# Patient Record
Sex: Male | Born: 1955 | Race: White | Hispanic: No | Marital: Married | Smoking: Former smoker
Health system: Southern US, Community
[De-identification: ages and names within clinical notes are randomized; demographics above are authoritative.]

## PROBLEM LIST (undated history)

## (undated) DIAGNOSIS — I2510 Atherosclerotic heart disease of native coronary artery without angina pectoris: Secondary | ICD-10-CM

## (undated) DIAGNOSIS — H409 Unspecified glaucoma: Secondary | ICD-10-CM

## (undated) DIAGNOSIS — I252 Old myocardial infarction: Secondary | ICD-10-CM

## (undated) DIAGNOSIS — I10 Essential (primary) hypertension: Secondary | ICD-10-CM

## (undated) DIAGNOSIS — E785 Hyperlipidemia, unspecified: Secondary | ICD-10-CM

## (undated) DIAGNOSIS — Z9861 Coronary angioplasty status: Secondary | ICD-10-CM

## (undated) HISTORY — DX: Unspecified glaucoma: H40.9

## (undated) HISTORY — DX: Coronary angioplasty status: Z98.61

## (undated) HISTORY — PX: CATARACT EXTRACTION W/ INTRAOCULAR LENS  IMPLANT, BILATERAL: SHX1307

## (undated) HISTORY — DX: Hyperlipidemia, unspecified: E78.5

## (undated) HISTORY — DX: Old myocardial infarction: I25.2

## (undated) HISTORY — DX: Essential (primary) hypertension: I10

## (undated) HISTORY — PX: EYE SURGERY: SHX253

## (undated) HISTORY — DX: Atherosclerotic heart disease of native coronary artery without angina pectoris: I25.10

---

## 2004-11-03 ENCOUNTER — Ambulatory Visit: Payer: Self-pay | Admitting: Family Medicine

## 2004-11-05 ENCOUNTER — Ambulatory Visit: Payer: Self-pay | Admitting: Family Medicine

## 2005-10-23 DIAGNOSIS — I2510 Atherosclerotic heart disease of native coronary artery without angina pectoris: Secondary | ICD-10-CM

## 2005-10-23 DIAGNOSIS — Z9861 Coronary angioplasty status: Secondary | ICD-10-CM

## 2005-10-23 DIAGNOSIS — I252 Old myocardial infarction: Secondary | ICD-10-CM

## 2005-10-23 HISTORY — PX: PERCUTANEOUS CORONARY STENT INTERVENTION (PCI-S): SHX6016

## 2005-10-23 HISTORY — DX: Old myocardial infarction: I25.2

## 2005-10-24 ENCOUNTER — Inpatient Hospital Stay (HOSPITAL_COMMUNITY): Admission: EM | Admit: 2005-10-24 | Discharge: 2005-10-28 | Payer: Self-pay | Admitting: Emergency Medicine

## 2005-11-18 ENCOUNTER — Encounter (HOSPITAL_COMMUNITY): Admission: RE | Admit: 2005-11-18 | Discharge: 2006-02-16 | Payer: Self-pay | Admitting: Cardiology

## 2005-12-07 ENCOUNTER — Ambulatory Visit: Payer: Self-pay | Admitting: Family Medicine

## 2006-01-19 ENCOUNTER — Ambulatory Visit: Payer: Self-pay | Admitting: Family Medicine

## 2006-01-20 ENCOUNTER — Ambulatory Visit: Payer: Self-pay | Admitting: Family Medicine

## 2006-04-21 DIAGNOSIS — E7800 Pure hypercholesterolemia, unspecified: Secondary | ICD-10-CM

## 2006-04-21 DIAGNOSIS — I2510 Atherosclerotic heart disease of native coronary artery without angina pectoris: Secondary | ICD-10-CM | POA: Insufficient documentation

## 2006-10-14 ENCOUNTER — Encounter: Payer: Self-pay | Admitting: Family Medicine

## 2006-11-07 ENCOUNTER — Telehealth: Payer: Self-pay | Admitting: Family Medicine

## 2006-11-09 ENCOUNTER — Encounter: Payer: Self-pay | Admitting: Family Medicine

## 2006-12-29 ENCOUNTER — Ambulatory Visit: Payer: Self-pay | Admitting: Family Medicine

## 2008-01-11 ENCOUNTER — Ambulatory Visit: Payer: Self-pay | Admitting: Family Medicine

## 2008-01-11 DIAGNOSIS — I10 Essential (primary) hypertension: Secondary | ICD-10-CM

## 2008-03-05 IMAGING — CR DG CHEST 1V PORT
1 series · 1 of 1 positions shown · non-contrast
Comparison: None

CLINICAL DATA: Chest pain, shortness of breath

PORTABLE CHEST - 1 VIEW:

[view not recorded]
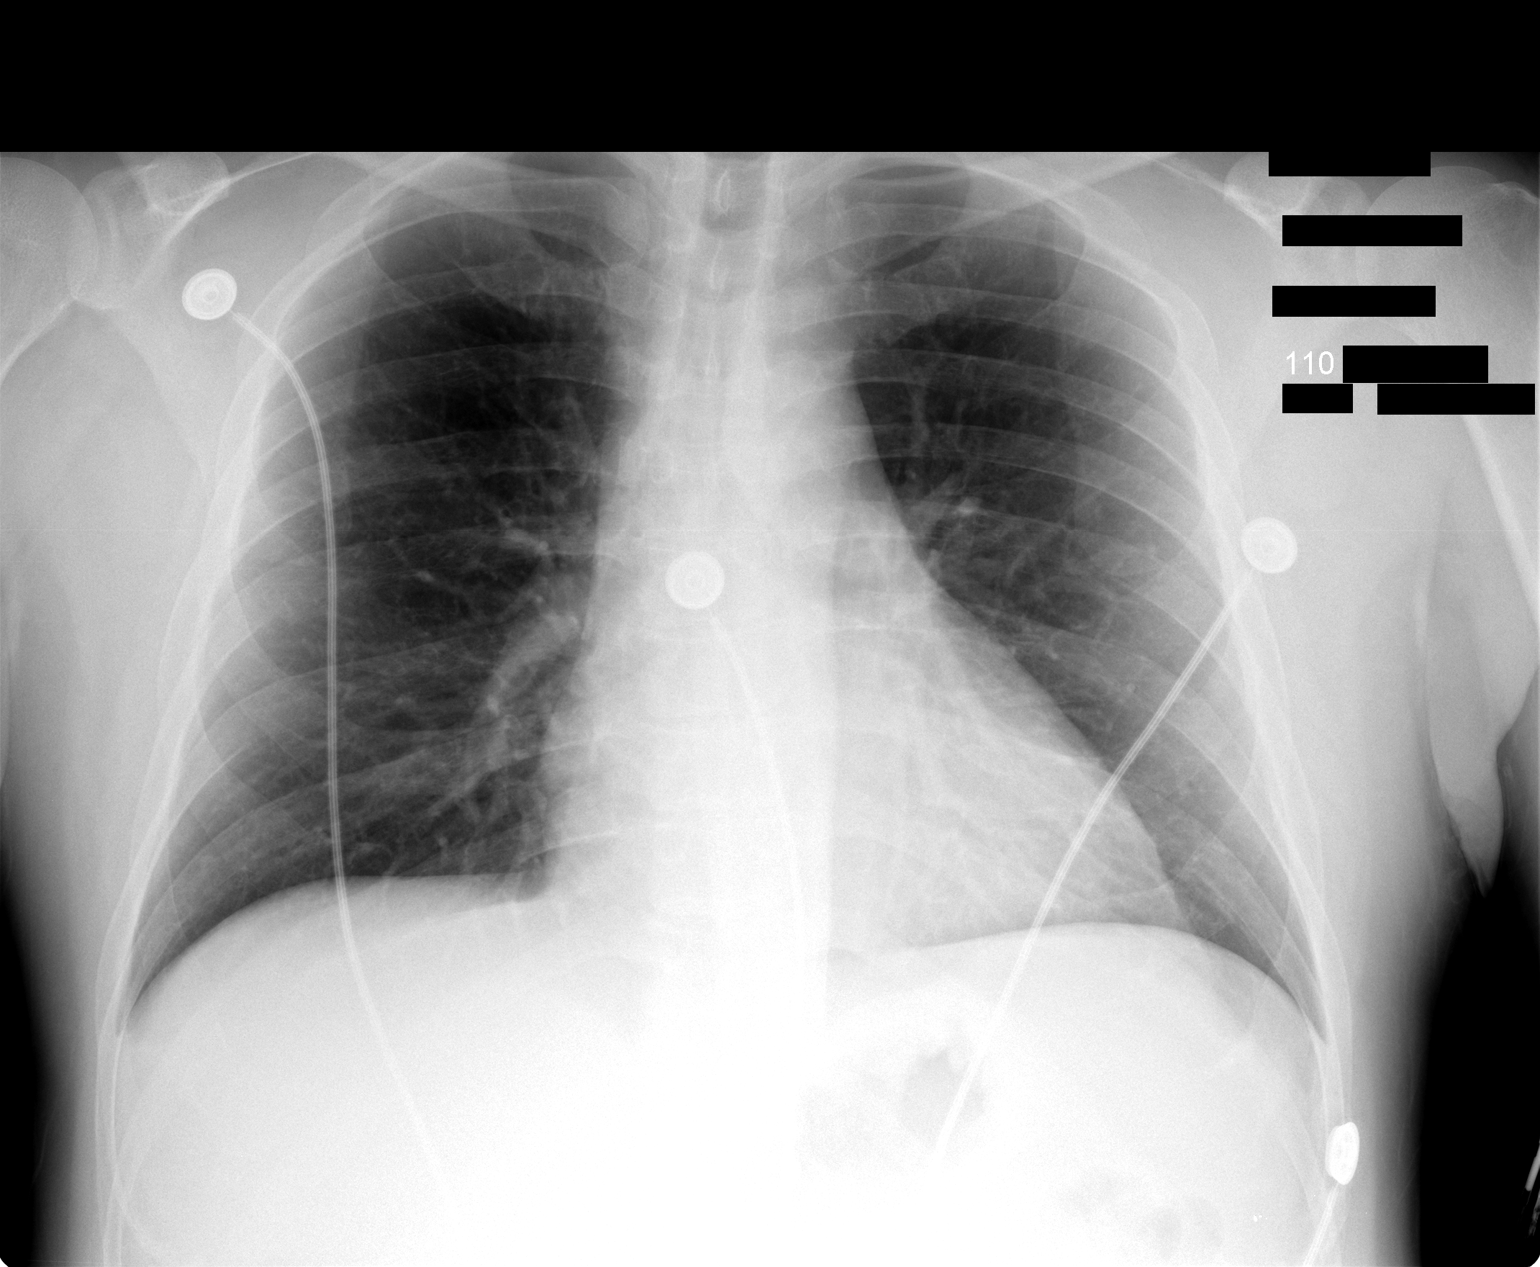

[1 of 1 positions shown; findings below may reference images not displayed]

FINDINGS: The heart size and mediastinal contours are within normal limits. 
Both lungs are clear.  No effusions. Visualized skeleton unremarkable.
IMPRESSION: No acute disease.

## 2008-05-14 ENCOUNTER — Encounter: Payer: Self-pay | Admitting: Family Medicine

## 2008-05-14 ENCOUNTER — Ambulatory Visit: Payer: Self-pay | Admitting: Family Medicine

## 2008-05-14 LAB — CONVERTED CEMR LAB
BUN: 20 mg/dL (ref 6–23)
CO2: 25 meq/L (ref 19–32)
Calcium: 9.5 mg/dL (ref 8.4–10.5)
Chloride: 105 meq/L (ref 96–112)
Cholesterol: 156 mg/dL (ref 0–200)
Creatinine, Ser: 0.87 mg/dL (ref 0.40–1.50)
HDL: 53 mg/dL (ref >39)
Total CHOL/HDL Ratio: 2.9

## 2008-05-15 ENCOUNTER — Encounter: Payer: Self-pay | Admitting: Family Medicine

## 2009-01-02 ENCOUNTER — Encounter: Payer: Self-pay | Admitting: Family Medicine

## 2009-05-08 ENCOUNTER — Ambulatory Visit: Payer: Self-pay | Admitting: Family Medicine

## 2009-05-21 ENCOUNTER — Ambulatory Visit: Payer: Self-pay | Admitting: Family Medicine

## 2009-05-21 ENCOUNTER — Encounter: Payer: Self-pay | Admitting: Family Medicine

## 2009-05-22 LAB — CONVERTED CEMR LAB
CO2: 23 meq/L (ref 19–32)
Calcium: 9 mg/dL (ref 8.4–10.5)
Cholesterol: 212 mg/dL — ABNORMAL HIGH (ref 0–200)
Creatinine, Ser: 0.78 mg/dL (ref 0.40–1.50)
Glucose, Bld: 104 mg/dL — ABNORMAL HIGH (ref 70–99)
Sodium: 141 meq/L (ref 135–145)
Total Bilirubin: 0.7 mg/dL (ref 0.3–1.2)
Total Protein: 6.4 g/dL (ref 6.0–8.3)
Triglycerides: 77 mg/dL (ref <150)
VLDL: 15 mg/dL (ref 0–40)

## 2010-01-14 ENCOUNTER — Ambulatory Visit: Payer: Self-pay | Admitting: Family Medicine

## 2010-01-14 LAB — CONVERTED CEMR LAB
Albumin: 4.4 g/dL (ref 3.5–5.2)
Indirect Bilirubin: 0.4 mg/dL (ref 0.0–0.9)
LDL Cholesterol: 103 mg/dL — ABNORMAL HIGH (ref 0–99)
Total CHOL/HDL Ratio: 3.7
Total Protein: 6.9 g/dL (ref 6.0–8.3)
Triglycerides: 153 mg/dL — ABNORMAL HIGH (ref <150)
VLDL: 31 mg/dL (ref 0–40)

## 2010-01-19 ENCOUNTER — Encounter: Payer: Self-pay | Admitting: Family Medicine

## 2010-03-10 ENCOUNTER — Encounter: Payer: Self-pay | Admitting: Family Medicine

## 2010-03-24 NOTE — Letter (Signed)
Summary: Generic Letter  [facility]  [address]   [place], [place] [address]   Phone: [phone]  Fax: [phone]    01/19/2010  SORIN FRIMPONG [address] [place], [place]  [address]  Dear Mr. Brinker,  Hope all is well.  Your cholesterol is better than last check but not quite as low as we would like.   You can continue to work on your diet and exercise and we can discuss at your next visit or we can change your cholesterol medicine.  Call if you would like to discuss.   Happy holidays!   Cholesterol               184 mg/dL                   6-606     ATP III Classification:           < 200        mg/dL        Desirable          200 - 239     mg/dL        Borderline High          >= 240        mg/dL        High         Triglyceride         [H]  153 mg/dL                   <301   HDL Cholesterol           50 mg/dL                    >60   Total Chol/HDL Ratio      3.7 Ratio  VLDL Cholesterol (Calc)                             31 mg/dL                    1-09  LDL Cholesterol (Calc)                        [H]  103 mg/dL                   3-23      Sincerely,  Doneta Public MD  Appended Document: Generic Letter mailed

## 2010-03-24 NOTE — Assessment & Plan Note (Signed)
Summary: cpe,tcb   Vital Signs:  Patient profile:   55 year old male Height:      65 inches Weight:      151 pounds BMI:     25.22 BSA:     1.76 Temp:     98.1 degrees F Pulse rate:   63 / minute BP sitting:   126 / 72  Vitals Entered By: Jone Baseman CMA (May 08, 2009 11:05 AM) CC: CPE Is Patient Diabetic? No Pain Assessment Patient in pain? no        CC:  CPE.  History of Present Illness: Feels well no complaints  Current Problems:  HYPERTENSION (ICD-401.9) taking medications regularly.  No chest pain or shortness of breath or lightheadedness Walks regulalry  HYPERCHOLESTEROLEMIA (ICD-272.0) taking zocor daily.  No right upper quadrant pain or muscle aches  CORONARY, ARTERIOSCLEROSIS (ICD-414.00) No chest pain with exertion or leg pain  ROS - as above PMH - Medications reviewed and updated in medication list.  Smoking Status noted in VS form      Habits & Providers  Alcohol-Tobacco-Diet     Tobacco Status: never  Current Medications (verified): 1)  Altace 10 Mg Caps (Ramipril) .[name].. 1 Daily For Blood Pressure 2)  Aspirin 325 Mg Tabs (Aspirin) .... Take 1 Tab By Mouth Every Day 3)  Metoprolol Succinate 25 Mg Tb24 (Metoprolol Succinate) .... Once A Day 4)  Simvastatin 40 Mg Tabs (Simvastatin) .... Take One Tablet At Bedtime  Allergies: No Known Drug Allergies  Review of Systems  The patient denies anorexia, fever, weight loss, weight gain, vision loss, decreased hearing, chest pain, peripheral edema, prolonged cough, headaches, hemoptysis, abdominal pain, melena, hematochezia, severe indigestion/heartburn, hematuria, incontinence, genital sores, muscle weakness, suspicious skin lesions, difficulty walking, depression, unusual weight change, enlarged lymph nodes, angioedema, and testicular masses.    Physical Exam  General:  Well-developed,well-nourished,in no acute distress; alert,appropriate and cooperative throughout examination Head:   Normocephalic and atraumatic without obvious abnormalities. No apparent alopecia or balding. Nose:  External nasal examination shows no deformity or inflammation. Nasal mucosa are pink and moist without lesions or exudates. Mouth:  Oral mucosa and oropharynx without lesions or exudates.  Teeth in good repair. Neck:  No deformities, masses, or tenderness noted. Lungs:  Normal respiratory effort, chest expands symmetrically. Lungs are clear to auscultation, no crackles or wheezes. Heart:  Normal rate and regular rhythm. S1 and S2 normal without gallop, murmur, click, rub or other extra sounds. Abdomen:  Bowel sounds positive,abdomen soft and non-tender without masses, organomegaly or hernias noted. Genitalia:  Testes bilaterally descended without nodularity, tenderness or masses. No scrotal masses or lesions. No penis lesions or urethral discharge. Msk:  No deformity or scoliosis noted of thoracic or lumbar spine.   Extremities:  No clubbing, cyanosis, edema, or deformity noted with normal full range of motion of all joints.   Skin:  Intact without suspicious lesions or rashes  diffuse rosacea/seborrhea Cervical Nodes:  No lymphadenopathy noted   Impression & Recommendations:  Problem # 1:  HYPERTENSION (ICD-401.9) Well controlled His updated medication list for this problem includes:    Altace 10 Mg Caps (Ramipril) .[name]... 1 daily for blood pressure    Metoprolol Succinate 25 Mg Tb24 (Metoprolol succinate) ..... Once a day  Future Orders: Comp Met-[facility] [id number]) ... 04/23/2010  BP today: 126/72 Prior BP: 121/76 (01/11/2008)  Labs Reviewed: K+: 4.5 (05/14/2008) Creat: : 0.87 (05/14/2008)   Chol: 156 (05/14/2008)   HDL: 53 (05/14/2008)   LDL: 88 (05/14/2008)  TG: 73 (05/14/2008)  Problem # 2:  HYPERCHOLESTEROLEMIA (ICD-272.0) Chck fasting soon  His updated medication list for this problem includes:    Simvastatin 40 Mg Tabs (Simvastatin) .[name]... Take one tablet at bedtime  Future  Orders: Lipid-[facility] ([id number]) ... 04/23/2010  Problem # 3:  ROUTINE GENERAL MEDICAL EXAM, NON PEDIATRIC (ICD-V70.0)  colonoscopy.   Discussed his steady mild weight gain and that needs to stop   Orders: [facility] - Est  40-64 yrs ([id number])  Complete Medication List: 1)  Altace 10 Mg Caps (Ramipril) .[name].. 1 daily for blood pressure 2)  Aspirin 325 Mg Tabs (Aspirin) .... Take 1 tab by mouth every day 3)  Metoprolol Succinate 25 Mg Tb24 (Metoprolol succinate) .... Once a day 4)  Simvastatin 40 Mg Tabs (Simvastatin) .... Take one tablet at bedtime  Other Orders: Future Orders: PSA-[facility] ([id number]) ... 04/23/2010  Patient Instructions: 1)  Please schedule a follow-up appointment in 1 year.  2)  I will send you a letter about your lab tests Remember to get them fasting 3)  Colonoscopy  Prevention & Chronic Care Immunizations   Influenza vaccine: Not documented    Tetanus booster: 01/11/2008: Tdap    Pneumococcal vaccine: Not documented  Colorectal Screening   Hemoccult: Not documented   Hemoccult due: Not Indicated    Colonoscopy: Not documented  Other Screening   PSA: 1.59  (05/14/2008)   PSA ordered.   Smoking status: never  (05/08/2009)  Lipids   Total Cholesterol: 156  (05/14/2008)   LDL: 88  (05/14/2008)   LDL Direct: Not documented   HDL: 53  (05/14/2008)   Triglycerides: 73  (05/14/2008)    SGOT (AST): 33  (05/14/2008)   SGPT (ALT): 29  (05/14/2008) CMP ordered    Alkaline phosphatase: 60  (05/14/2008)   Total bilirubin: 0.6  (05/14/2008)    Lipid flowsheet reviewed?: Yes   Progress toward LDL goal: At goal  Hypertension   Last Blood Pressure: 126 / 72  (05/08/2009)   Serum creatinine: 0.87  (05/14/2008)   Serum potassium 4.5  (05/14/2008) CMP ordered     Hypertension flowsheet reviewed?: Yes   Progress toward BP goal: At goal  Self-Management Support :    Hypertension self-management support: Not documented    Hypertension self-management support  not done because: Good outcomes  (05/08/2009)    Lipid self-management support: Not documented     Lipid self-management support not done because: Good outcomes  (05/08/2009)

## 2010-03-26 ENCOUNTER — Encounter: Payer: Self-pay | Admitting: *Deleted

## 2010-03-26 NOTE — Consult Note (Signed)
Summary: [facility]   Imported By: De Nurse 03/20/2010 15:57:43  _____________________________________________________________________  External Attachment:    Type:   Image     Comment:   External Document

## 2010-07-10 NOTE — Discharge Summary (Signed)
[name], [name]               ACCOUNT NO.:  [id number]   MEDICAL RECORD NO.:  [id number]          PATIENT TYPE:  INP   LOCATION:  6525                         FACILITY:  [facility]   PHYSICIAN:  Cristy Hilts. Jacinto Halim, MD       DATE OF BIRTH:  [birth year]   DATE OF ADMISSION:  10/24/2005  DATE OF DISCHARGE:  10/28/2005                               DISCHARGE SUMMARY   PRIMARY CARE PHYSICIAN:  Dr. Deirdre Priest.   FINAL DIAGNOSES:  1. Non-ST-elevation myocardial infarction.  2. Hypertension.  3. Dyslipidemia.  4. Obesity.  5. Obstructive sleep apnea.  6. Obesity.  7. Alcohol use.  8. Gastroesophageal reflux.  9. Nonsustained ventricular tachycardia.   PROCEDURE:  Left heart catheterization and coronary arteriogram  performed on October 26, 2005 by Dr. Yates Decamp revealing normal LV  function without mitral regurgitation.  Angiographic information:  The  right coronary artery had mild luminal irregularities in the proximal  vessel as well as the mid-vessel, which was followed by tandem 99%  thrombotic stenosis in the distal RCA.  In the PDA, there was mild 40 to  50% haziness in the ostium.  The left main was normal.  The circumflex  was normal.  The ramus was normal.  The LAD was normal as well as the  first diagonal.  Intervention:  PTCA and stenting were performed on the  right coronary artery with a 3 x 13 mm Cypher stent overlapped with a  3.5 x 18 mm Cypher stent, taking 99% stenosis to 0% residual.   COMPLICATIONS:  None.   BRIEF HISTORY:  [name] is a 55 year old male with a history of  dyslipidemia and hypertension who presented to the emergency department  at [facility] emergency department with arm pain and chest discomfort  for five days intermittently.  It lasted one to two minutes and was  accompanied by mild diaphoresis.  He was admitted to [facility]  to rule out myocardial infarction and for further evaluation.  Please  see history and physical for further  details.   HOSPITAL COURSE:  He underwent left heart catheterization and coronary  arteriogram on October 26, 2005 by Dr. Jacinto Halim with the above findings  after elevation of enzymes was noted.  His EKG remained stable without  evidence of acute changes.  He underwent PTCA stenting of the right  coronary artery and he tolerated the procedure without difficulty.  Ejection fraction on cath was noted to be 60%.  He did suffer non-ST-  elevation myocardial infarction with an elevated troponin.  He also had  60% residual disease in the ostium of the PDA and it was recommended by  Dr. Jacinto Halim that we treat this medically with aggressive management of his  lipids and hypertension.  On October 28, 2005, noted a 14-beat run of  nonsustained ventricular tachycardia.  This occurred post-PCI with which  he was asymptomatic.  On the day of discharge, he is ambulating in the  hallway without difficulty, no chest pain or shortness of breath, no  lightheadedness or syncope, no palpitations or tachycardia.   LABORATORY DATA:  Cardiac markers on admission:  CK was 26, CK-MB 12.4  with a troponin of 0.04.  On October 25, 2005, his CK was 26, CK-MB  12.7, and troponin 0.015.  On October 26, 2005, his CK was 48, CK-MB  11.5 and troponin was 0.68.  Lipid panel revealed a triglyceride level  of 229, HDL 37, and LDL of 114, total cholesterol was 197.   DISCHARGE INSTRUCTIONS:  He was instructed to return to work after he  followed up with Dr. Jacinto Halim on November 05, 2005.  He was to follow a  low-fat, low-cholesterol, low-sodium diet and increase his activity  slowly.  He was to avoid driving for the week following his discharge as  well as to avoid heavy lifting for the next two weeks.  He is to contact  us with any pain, redness, swelling or drainage from his cath site or  with any problems or questions.  He is scheduled for a follow-up  appointment with Dr. Jacinto Halim on November 05, 2005 at 12:15 PM and it was   recommended that he have a liver and lipid panel done in three to four  weeks post-discharge.   DISCHARGE MEDICATIONS:  1. Aspirin 325 mg daily.  2. Plavix 75 mg daily.  3. Altace 10 mg daily.  4. Protonix 40 mg daily.  5. Vytorin 10/40 mg daily.  6. Coreg CR 40 mg daily.  7. Sublingual nitroglycerin as needed.     ______________________________  Charmian Muff, NP      Cristy Hilts Jacinto Halim, MD  Electronically Signed    LS/MEDQ  D:  03/30/2006  T:  03/31/2006  Job:  [id number]   cc:   Pearlean Brownie, M.D.

## 2010-07-10 NOTE — Cardiovascular Report (Signed)
NAMECRAY, MONNIN               ACCOUNT NO.:  [id number]   MEDICAL RECORD NO.:  [id number]          PATIENT TYPE:  INP   LOCATION:  6525                         FACILITY:  [facility]   PHYSICIAN:  Cristy Hilts. Jacinto Halim, MD       DATE OF BIRTH:  01-21-1956   DATE OF PROCEDURE:  10/26/2005  DATE OF DISCHARGE:                              CARDIAC CATHETERIZATION   PROCEDURE PERFORMED:  1. Left ventriculography.  2. Selective right and left coronary angiography.  3. Percutaneous transluminal coronary angioplasty and stenting of the      right coronary artery.   INDICATIONS FOR PROCEDURE:  Mr. Spease is a 55 year old gentleman with a  history of borderline hypertension and hyperlipidemia who was admitted to  the hospital with unstable angina with chest pain with radiation to both his  elbow and his wrist.  His troponins were positive for myocardial injury.  With this, he was brought to the cardiac catheterization lab to evaluate his  coronary anatomy.   HEMODYNAMIC DATA:  Left ventricular pressure 139/7 with end diastolic  pressure of 15 mmHg.  The aortic pressure was 142/82 with a mean of 107  mmHg.  There was no pressure gradient across the aortic valve.   ANGIOGRAPHIC DATA:  Left ventricle:  The left ventricular systolic function was normal with  ejection fraction of 60%.  There was no significant mitral regurgitation.   Right coronary artery:  The right coronary artery is a large vessel and is  dominant vessel.  It has a mild luminal irregularity in its proximal and mid  segment followed by a tandem 99% thrombotic stenosis in the distal RCA.  The  RCA gives origin to a large PLV and a moderate size PDA which has mild 40-  50% haziness in its ostium.   Left main:  The left main is large caliber vessel, this appears normal.   Circumflex:  The circumflex is a small vessel and is normal.   Ramus intermediate:  The ramus intermediate is a moderate to large caliber  vessel.  It is smooth and  normal.   Left anterior descending:  The left anterior descending  is a large caliber  vessel.  It ends before reaching the apex.  It is smooth and normal.  The  LAD gives origin to a large diagonal 1 which is smooth and normal.   INTERVENTIONAL DATA:  Successful PTCA and stenting of the RCA with a 3 by 13  mm and overlapped with a 3.5 by 18 mm Cypher.  These stents were deployed at  16 atmospheres pressure.  Overall, the stenosis was reduced from 99% to 0%  with TIMI III to TIMI III flow maintained at the end of the procedure.  There was residual 60% stenosis in the ostium of the PDA which is small to  moderate size.   RECOMMENDATIONS:  Continued aggressive risk factor modification is  indicated.  The LDL goal is less than or equal to 70.  Blood pressure goals  of 130/80 mmHg with weight loss and exercise program is indicated.   A total of 150 mL  of contrast was utilized for diagnostic angiography.   TECHNIQUES OF PROCEDURE:  In the usual sterile technique, using a 6 French  right femoral artery access,  a 6 [place] multipurpose B2 catheter was  advanced from the ascending aorta and a 0.035 inch J-wire.  The catheter was  gently advanced to the left ventricle.  Left ventricular pressure was  monitored.  Hand contrast injection of the left ventricle was performed,  both in LAO and RAO projections.  The catheter was flushed with saline and  pulled back into the ascending aorta and pressure gradient across the aortic  valve was monitored.  The right coronary artery was selectively engaged and  angiography was performed.  Then, the left main coronary artery was  selectively engaged and angiography was performed.  Then, the catheter was  pulled out of the body in the usual fashion.   TECHNIQUES OF INTERVENTION:  Using Angiomax for anticoagulation and a 7  [place] AR1 as a guiding catheter, 190 cm by 0.014 inch ADW guide-wire was  advanced in the right coronary artery and was carefully  positioned in the  PLV branch.  Then, using a 3 by 20 mm Maverick balloon, multiple inflations  at 14 atmospheres pressure were performed.  Post procedure, the stenosis was  reduced to around 50% but there was still the presence of haziness.  I could  visualize the thrombus between the tandem stenosis.  Then, I decided to  proceed with stenting with a 3 by 13 mm Cypher, this was deployed at 12  atmospheres pressure for 50 seconds.  A second 3.5 by 23 mm stent was  utilized to overlap the previously placed distal stent.  This was deployed  at 16 atmospheres pressure for 60 seconds.  The same stent balloon was  advanced distally and keeping the balloon at the edge of the 3 mm stent, I  performed a 10 atmosphere balloon inflation for 45 seconds.  Then the  balloon was pulled back between the two stents closer to the second stent in  flow and a 16 atmospheres pressure dilatation was performed for 45 seconds.  The balloon was then deflated, pulled back in the guiding catheter, and  angiography was performed.  Intracoronary nitroglycerin was administered at  multiple episodes.   Because of the presence of thrombus following balloon angioplasty, I decided  to use bail out Integrilin.  Overall, at the end of the procedure, excellent  brisk flow was noted without any evidence of thrombus or evidence of  dissection.  TIMI-III flow was noted.  The patient tolerated the procedure  well.  No immediate complications were noted.      Cristy Hilts. Jacinto Halim, MD  Electronically Signed     JRG/MEDQ  D:  10/26/2005  T:  10/26/2005  Job:  [id number]   cc:   Deirdre Peer. Nehemiah Settle, M.D.  Pearlean Brownie, M.D.

## 2010-07-10 NOTE — H&P (Signed)
NAMEVALMORE, ARABIE               ACCOUNT NO.:  [id number]   MEDICAL RECORD NO.:  [id number]          PATIENT TYPE:  EMS   LOCATION:  ED                           FACILITY:  [facility]   PHYSICIAN:  Deirdre Peer. Polite, M.D. DATE OF BIRTH:  05/25/1955   DATE OF ADMISSION:  10/24/2005  DATE OF DISCHARGE:                                HISTORY & PHYSICAL   CHIEF COMPLAINT:  Chest pain.   HISTORY OF PRESENT ILLNESS:  This 55 year old male with no significant past  medical history except borderline hypertension on no meds, who presents to  the ED after having recurrent bouts of atypical chest pain.  According to  the patient, __________to the last week, he has been walking to work  approximately three blocks.  The patient stated that after walking about  three blocks, he felt an unusual pain in his elbows and wrists.  It seemed  to last about 30 to 60 seconds.  The patient has had recurrence of his  symptoms upon walking to work the following days of Wednesday as well as  Thursday.  The patient stated that he took some NSAID's that he thinks made  him feel a little better.  On Saturday, the patient stated that he awoke and  felt that unusual sensation again, however, he still tried to walk the  sensation off.  Upon walking, he had increasing shortness of breath, this  time stated that it seemed to radiate from his chest to both arms and felt  weak.  He denied any palpitations, no shortness of breath, no diaphoresis.  At baseline, the patient denies any orthopnea, PND, dyspnea on exertion;  however, he has not been exercising much in the past.  Denies any pedal  edema.  He states that his lipid status is unknown.  He does have a family  history of coronary artery disease.  In the ED, the patient was evaluated  and found to be hypertensive; otherwise, hemodynamically stable.  His EKG is  without acute changes, point-of-care enzymes within normal limits, chest x-  ray no current disease, CBC and  BMET within normal limits.  [facility] was called for evaluation and admission.  Admission is deemed  necessary for further evaluation and treatment.   PAST MEDICAL HISTORY:  1. Borderline hypertension.  2. Probable GERD for which he has been taking occasional Rolaids.   MEDICATIONS ON ADMISSION:  Occasional Rolaids.   SOCIAL HISTORY:  Negative for tobacco, positive for alcohol 6 to 12 beers on  the weekend, denies any withdrawal symptoms, denies any drugs.   PAST SURGICAL HISTORY:  Surgery for cataracts.   ALLERGIES:  None.   FAMILY HISTORY:  His father with an MI in his 8s plus a history of  hypertension, mother coronary artery disease, and brothers with  hypertension.   REVIEW OF SYSTEMS:  As stated in the HPI; otherwise negative.   PHYSICAL EXAMINATION:  GENERAL:  The patient is alert and oriented x3.  VITAL SIGNS:  Temp 98.6, BP 145/90, pulse 78, respiratory rate of 20,  saturation 98%.  HEENT:  Within normal limits.  CHEST:  Clear to auscultation bilateral without rales, rhonchi, or wheezes.  CARDIOVASCULAR:  Regular, no S3.  The patient does not have any carotid  bruits.  ABDOMEN:  Slightly obese and nontender.  EXTREMITIES:  No clubbing, cyanosis, or edema.  No calf pain.   ASSESSMENT:  1. Chest pain.  2. Probable hypertension.  3. Family history of coronary artery disease.   Recommend the patient be admitted to a telemetry floor bed.  The patient  will have serial cardiac enzymes, and he will be seen by Cardiology for  further risk stratification, i.e., stress test.  Will obtain baseline labs,  i.e., lipids, a TSH, and a hemoglobin A1c.  Will place this patient on a PPI  as well as DVT prophylaxis, and make further recommendations as deemed  necessary.      Deirdre Peer. Polite, M.D.  Electronically Signed     RDP/MEDQ  D:  10/24/2005  T:  10/24/2005  Job:  [id number]

## 2010-07-10 NOTE — Discharge Summary (Signed)
[name], [name]               ACCOUNT NO.:  [id number]   MEDICAL RECORD NO.:  [id number]          PATIENT TYPE:  INP   LOCATION:  1415                         FACILITY:  [facility]   PHYSICIAN:  Cristy Hilts. Jacinto Halim, MD       DATE OF BIRTH:  04/15/1955   DATE OF ADMISSION:  10/24/2005  DATE OF DISCHARGE:  10/26/2005                                 DISCHARGE SUMMARY   ADMITTING PHYSICIAN:  Deirdre Peer. Polite, M.D.   Tera Partridge CARDIOLOGISTCristy Hilts. Jacinto Halim, M.D.   FINAL DIAGNOSES:  1. Coronary atherosclerotic heart disease.  2. Unstable angina pectoris.  3. Elevated cardiac enzymes with elevated troponin.   Dictation ended at this point.  Requested to cancel.     ______________________________  Charmian Muff, NP      Cristy Hilts Jacinto Halim, MD     LS/MEDQ  D:  10/28/2005  T:  10/28/2005  Job:  [id number]

## 2010-07-10 NOTE — Consult Note (Signed)
NAMEDELONTE, MUSICH               ACCOUNT NO.:  [id number]   MEDICAL RECORD NO.:  [id number]          PATIENT TYPE:  EMS   LOCATION:  ED                           FACILITY:  [facility]   PHYSICIAN:  Cristy Hilts. Jacinto Halim, MD       DATE OF BIRTH:  [birth year]   DATE OF CONSULTATION:  DATE OF DISCHARGE:                                   CONSULTATION   REASON FOR CONSULTATION:  Chest pain, unstable angina.   HISTORY:  Harry Shuck is a 55 year old gentleman with borderline  hyperlipidemia and history of hypertension, presently not on medical  therapy.  Has been complaining of chest pain which has been going on for the  past 5 days.   Describes it as a pressure like sensation in the middle of his chest with  radiation to both his arms, both his elbow and wrist.  This occurred for  about a minute or two with some spontaneous relief .  The severity of the  pain is described as very mild.  However, in the last 2 days he has had more  frequent episodes and today, this morning, after he woke up had three  episodes, one after another, and hence he presented to the emergency room.   He had one mild episode of similar chest pain while he was in the hospital.  However, physically he feels comfortable and he denies any chest pain.   This last episode of chest pain at home was associated with mild  diaphoresis.  No associated nausea, vomiting.   REVIEW OF SYSTEMS:  He denies any shortness of breath, paroxysmal nocturnal  dyspnea or orthopnea.  He has not had any syncope.  He has no bowel or  bladder disease.  He is not a diabetic.  His other systems are negative.  He  does have significant snoring.  Has sleep apnea symptoms.   PAST MEDICAL HISTORY:  Hypertension, hyperlipidemia, not on therapy.   PAST SURGICAL HISTORY:  He has had bilateral cataract surgery about 20 years  ago.   SOCIAL HISTORY:  He is married.  Lives with his wife.  Works as a Comptroller  at [employer].  He does not smoke.  He drinks about 8  to 10 beers on Saturday and  again, 8 to 10 beers on Sunday.  He has markedly sedentary lifestyle.   FAMILY HISTORY:  Positive for coronary artery disease.  Father died with  massive myocardial infarction and stroke at the age of 38.  Mother had  coronary disease and had blockages in her heart at 53 years of age.  Has 2  brothers, 82 and 60, have hypertension.  A sister who is 33, alive, healthy  with hypertension.   PRESENT MEDICATIONS:  None.   ALLERGIES:  NO KNOWN DRUG ALLERGIES.   PHYSICAL EXAMINATION:  GENERAL:  He is well built.  Appears to be obese.  He  appears to be in no acute distress .  VITAL SIGNS:  Heart rate 97.6, pulse 76 beats per minute , regular  respiration of 16, blood pressure 156/92 mmHg both arms.  CARDIAC:  S1, S2 was normal.  There is no gallop or murmur appreciated.  CHEST:  Bilaterally clear with no crackles.  ABDOMEN:  Obese.  No hepatosplenomegaly.  Bowel sounds heard.  EXTREMITIES:  No edema.  Peripheral aspect exam was normal.  There was no  lymphadenopathy.   EKG revealed normal sinus rhythm without any ST-T wave changes or ischemia.  His electrolytes, BUN and creatinine were within normal limits.  CBC was  within normal limits.  His point of care markers for cardiac injury were  negative x2.   IMPRESSION:  1. Chest pain with radiation to both the elbows and the wrist, suggestion      of unstable angina.  This unstable angina could be related to      uncontrolled hypertension with htn heart disease heart disease with      subendocardial ischemia versus underlying coronary artery disease.  2. Hypertension.  3. Hyperlipidemia.  4. Obesity.  5. Alcohol abuse.  6. Strong family history of coronary artery disease.  7. Sedentary lifestyle.   RECOMMENDATIONS:  1. I am going to agree with this admission and he needs aggressive blood      pressure control.  His chest pain could be related to  myocardial      ischemia.  Patient admitted to the  hospital.  I agree with aggressive      control of blood pressure.  I will start him on Coreg at 6.25 b.i.d.      along with Altace 5 mg p.o. at bedtime.  As he is presently      asymptomatic I agree with sublingual nitroglycerine on a p.r.n. basis.      However, I would fully anticoagulate him with Lovenox, full dose.  2. If he has complete relief of chest pain without any recurrence, then we      could potentially consider either discharging 24 to 48 hours versus      proceeding directly with heart catheterization to delineate his current      anatomy.  His wife is present at the bedside.  I have discussed with      her regarding alternatives and risk associated with heart      catheterization including less than 1% risk of death, stroke, MI and      urgent need for bypass surgery with cardiac catheterization and 2-3%      with  PCI but not limited to these.  Patient understands.  3. We will check his lipids.  I will also start him on lipid-lower      therapy.  4. Lifestyle modification.  Risk factor modification is indicated.  I have      discussed these issues with the patient and his wife.      Cristy Hilts. Jacinto Halim, MD  Electronically Signed     JRG/MEDQ  D:  10/24/2005  T:  10/25/2005  Job:  [id number]   cc:   Deirdre Peer. Nehemiah Settle, M.D.   Pearlean Brownie, M.D.

## 2011-02-10 ENCOUNTER — Ambulatory Visit: Payer: Self-pay | Admitting: Family Medicine

## 2012-06-07 ENCOUNTER — Other Ambulatory Visit (HOSPITAL_COMMUNITY): Payer: Self-pay | Admitting: Cardiology

## 2012-06-07 DIAGNOSIS — I208 Other forms of angina pectoris: Secondary | ICD-10-CM

## 2012-06-13 ENCOUNTER — Ambulatory Visit (HOSPITAL_COMMUNITY)
Admission: RE | Admit: 2012-06-13 | Discharge: 2012-06-13 | Disposition: A | Discharge status: Home / Self Care | Payer: BC Managed Care – PPO | Source: Ambulatory Visit | Attending: Cardiology | Admitting: Cardiology

## 2012-06-13 DIAGNOSIS — I208 Other forms of angina pectoris: Secondary | ICD-10-CM

## 2012-06-13 DIAGNOSIS — I209 Angina pectoris, unspecified: Secondary | ICD-10-CM | POA: Insufficient documentation

## 2012-06-13 MED ORDER — TECHNETIUM TC 99M SESTAMIBI GENERIC - CARDIOLITE
31.0000 | Freq: Once | INTRAVENOUS | Status: AC | PRN
  Administered 2012-06-13: 31 via INTRAVENOUS

## 2012-06-13 MED ORDER — TECHNETIUM TC 99M SESTAMIBI GENERIC - CARDIOLITE
10.2000 | Freq: Once | INTRAVENOUS | Status: AC | PRN
  Administered 2012-06-13: 10 via INTRAVENOUS

## 2012-06-13 NOTE — Procedures (Addendum)
[name] [address] [place] [address] [phone]  Cardiology Nuclear Med Study  [name] is a 57 y.o. male     MRN : [id number]     DOB: 11-05-1955  Procedure Date: 06/13/2012  Nuclear Med Background Indication for Stress Test:  Stent Patency History:  CAD;STENT/PTCA--10/2005 Cardiac Risk Factors: Family History - CAD, History of Smoking, Hypertension and Lipids  Symptoms:  Chest Pain and Fatigue   Nuclear Pre-Procedure Caffeine/Decaff Intake:  7:00pm NPO After: 5:00am   IV Site: R Antecubital  IV 0.9% NS with Angio Cath:  22g  Chest Size (in):  42" IV Started by: Emmit Pomfret, RN  Height: 5\' 5"  (1.651 m)  Cup Size: n/a  BMI:  Body mass index is 24.63 kg/(m^2). Weight:  148 lb (67.132 kg)   Tech Comments:  N/A    Nuclear Med Study 1 or 2 day study: 1 day  Stress Test Type:  Stress  Order Authorizing Provider:  Bryan Lemma, MD   Resting Radionuclide: Technetium 55m Sestamibi  Resting Radionuclide Dose: 10.2 mCi   Stress Radionuclide:  Technetium 16m Sestamibi  Stress Radionuclide Dose: 31.0 mCi           Stress Protocol Rest HR: 69 Stress HR: 142  Rest BP: 158/84 Stress BP: 190/105  Exercise Time (min): 12:00 METS: 13.4   Predicted Max HR: 166 bpm % Max HR: 85.54 bpm Rate Pressure Product: 57846  Dose of Adenosine (mg):  n/a Dose of Lexiscan: n/a mg  Dose of Atropine (mg): n/a Dose of Dobutamine: n/a mcg/kg/min (at max HR)  Stress Test Technologist: Esperanza Sheets, CCT Nuclear Technologist: Gonzella Lex, CNMT   Rest Procedure:  Myocardial perfusion imaging was performed at rest 45 minutes following the intravenous administration of Technetium 69m Sestamibi. Stress Procedure:  The patient performed treadmill exercise using a Bruce  Protocol for 12:00 minutes. The patient stopped due to SOB and Fatigue and denied any chest pain.  There were no significant ST-T wave changes.  Technetium 49m  Sestamibi was injected at peak exercise and myocardial perfusion imaging was performed after a brief delay.  Transient Ischemic Dilatation (Normal <1.22):  0.89 Lung/Heart Ratio (Normal <0.45):  0.33 QGS EDV:  70 ml QGS ESV:  15 ml LV Ejection Fraction: 78%  Signed by : Gonzella Lex, CNMT  PHYSICIAN INTERPRETATION  Rest ECG: NSR with non-specific ST-T wave changes  Stress ECG: No significant change from baseline ECG - insignificant up-sloping ST depressions.  QPS Raw Data Images:  Acquisition technically good; normal left ventricular size. Stress Images:  There is decreased uptake in the inferior wall. There is decreased uptake in the septum.  There is a medium to large sized, moderate to severe intensity reversible perfusion defect in the basal to apical inferior-inferoseptal wall. Rest Images:  There is minimally reduced tracer uptake in the inferior & inferolateral wall. Subtraction (SDS):  These findings are consistent with ischemia. Consistent with severe disease in the RCA distribution.   Impression Exercise Capacity:  Excellent exercise capacity. BP Response:  Hypertensive blood pressure response. Clinical Symptoms:  Mild chest pain/dyspnea. Fatigue ECG Impression:  Insignificant upsloping ST segment depression. LV Wall Motion:  NL LV Function; NL Wall Motion, but poor thickening in the inferior-inferoseptal walls.  Comparison with Prior Nuclear Study: The reversible perfusion defect was not present on the previous study from 2008.  Overall Impression:  High risk stress nuclear study.  Significant area of probable ischemia in the  RCA distribution.  Recommend cardiac catheterization.   Marykay Lex, MD  06/13/2012 5:08 PM

## 2012-06-23 ENCOUNTER — Other Ambulatory Visit: Payer: Self-pay | Admitting: Cardiology

## 2012-06-23 ENCOUNTER — Encounter: Payer: Self-pay | Admitting: Cardiology

## 2012-06-23 DIAGNOSIS — I2510 Atherosclerotic heart disease of native coronary artery without angina pectoris: Secondary | ICD-10-CM

## 2012-06-23 DIAGNOSIS — R9439 Abnormal result of other cardiovascular function study: Secondary | ICD-10-CM | POA: Insufficient documentation

## 2012-06-23 DIAGNOSIS — Z955 Presence of coronary angioplasty implant and graft: Secondary | ICD-10-CM | POA: Insufficient documentation

## 2012-06-23 MED ORDER — ATORVASTATIN CALCIUM 20 MG PO TABS: 20.0000 mg | ORAL_TABLET | Freq: Every day | ORAL | Status: DC

## 2012-06-26 ENCOUNTER — Encounter: Payer: Self-pay | Admitting: *Deleted

## 2012-06-26 ENCOUNTER — Other Ambulatory Visit: Payer: Self-pay | Admitting: *Deleted

## 2012-06-26 ENCOUNTER — Encounter (HOSPITAL_COMMUNITY): Payer: Self-pay | Admitting: Pharmacy Technician

## 2012-06-26 DIAGNOSIS — Z01818 Encounter for other preprocedural examination: Secondary | ICD-10-CM

## 2012-06-26 NOTE — Addendum Note (Signed)
Addended byHerbie Baltimore, Laykin Rainone on: 06/26/2012 03:15 PM   Modules accepted: Orders

## 2012-06-27 ENCOUNTER — Encounter: Payer: Self-pay | Admitting: Cardiology

## 2012-06-28 ENCOUNTER — Encounter (HOSPITAL_COMMUNITY)
Admission: RE | Disposition: A | Discharge status: Home / Self Care | Payer: Self-pay | Source: Ambulatory Visit | Attending: Cardiology

## 2012-06-28 ENCOUNTER — Encounter (HOSPITAL_COMMUNITY): Payer: BC Managed Care – PPO

## 2012-06-28 ENCOUNTER — Encounter (HOSPITAL_COMMUNITY): Payer: Self-pay | Admitting: Cardiothoracic Surgery

## 2012-06-28 ENCOUNTER — Ambulatory Visit (HOSPITAL_COMMUNITY)
Admission: RE | Admit: 2012-06-28 | Discharge: 2012-06-30 | Disposition: A | Discharge status: Home / Self Care | Payer: BC Managed Care – PPO | Source: Ambulatory Visit | Attending: Cardiology | Admitting: Cardiology

## 2012-06-28 ENCOUNTER — Other Ambulatory Visit: Payer: Self-pay | Admitting: *Deleted

## 2012-06-28 DIAGNOSIS — Z01818 Encounter for other preprocedural examination: Secondary | ICD-10-CM

## 2012-06-28 DIAGNOSIS — Z8249 Family history of ischemic heart disease and other diseases of the circulatory system: Secondary | ICD-10-CM | POA: Insufficient documentation

## 2012-06-28 DIAGNOSIS — I10 Essential (primary) hypertension: Secondary | ICD-10-CM

## 2012-06-28 DIAGNOSIS — I200 Unstable angina: Secondary | ICD-10-CM | POA: Diagnosis not present

## 2012-06-28 DIAGNOSIS — I2510 Atherosclerotic heart disease of native coronary artery without angina pectoris: Secondary | ICD-10-CM

## 2012-06-28 DIAGNOSIS — Z955 Presence of coronary angioplasty implant and graft: Secondary | ICD-10-CM

## 2012-06-28 DIAGNOSIS — R9439 Abnormal result of other cardiovascular function study: Secondary | ICD-10-CM | POA: Insufficient documentation

## 2012-06-28 DIAGNOSIS — Z9861 Coronary angioplasty status: Secondary | ICD-10-CM | POA: Insufficient documentation

## 2012-06-28 DIAGNOSIS — E7800 Pure hypercholesterolemia, unspecified: Secondary | ICD-10-CM | POA: Insufficient documentation

## 2012-06-28 DIAGNOSIS — I209 Angina pectoris, unspecified: Secondary | ICD-10-CM | POA: Diagnosis present

## 2012-06-28 HISTORY — PX: LEFT HEART CATHETERIZATION WITH CORONARY ANGIOGRAM: SHX5451

## 2012-06-28 LAB — CBC
Hemoglobin: 14.2 g/dL (ref 13.0–17.0)
MCH: 31.6 pg (ref 26.0–34.0)
Platelets: 147 10*3/uL — ABNORMAL LOW (ref 150–400)
RBC: 4.49 MIL/uL (ref 4.22–5.81)
WBC: 6 10*3/uL (ref 4.0–10.5)

## 2012-06-28 LAB — CREATININE, SERUM
Creatinine, Ser: 0.81 mg/dL (ref 0.50–1.35)
GFR calc Af Amer: >90 mL/min (ref >90)

## 2012-06-28 SURGERY — LEFT HEART CATHETERIZATION WITH CORONARY ANGIOGRAM
Anesthesia: LOCAL

## 2012-06-28 MED ORDER — SODIUM CHLORIDE 0.9 % IJ SOLN: 3.0000 mL | Freq: Two times a day (BID) | INTRAMUSCULAR | Status: DC

## 2012-06-28 MED ORDER — SODIUM CHLORIDE 0.9 % IV SOLN
1.0000 mL/kg/h | INTRAVENOUS | Status: AC
  Administered 2012-06-28: 1 mL/kg/h via INTRAVENOUS

## 2012-06-28 MED ORDER — PRASUGREL HCL 10 MG PO TABS
10.0000 mg | ORAL_TABLET | Freq: Every day | ORAL | Status: DC
  Administered 2012-06-29 – 2012-06-30 (×2): 10 mg via ORAL
  Filled 2012-06-28 (×2): qty 1

## 2012-06-28 MED ORDER — FENTANYL CITRATE 0.05 MG/ML IJ SOLN
INTRAMUSCULAR | Status: AC
  Filled 2012-06-28: qty 2

## 2012-06-28 MED ORDER — MIDAZOLAM HCL 2 MG/2ML IJ SOLN
INTRAMUSCULAR | Status: AC
  Filled 2012-06-28: qty 2

## 2012-06-28 MED ORDER — SODIUM CHLORIDE 0.9 % IV SOLN
INTRAVENOUS | Status: DC
  Administered 2012-06-28: 08:00:00 via INTRAVENOUS

## 2012-06-28 MED ORDER — HEPARIN SODIUM (PORCINE) 1000 UNIT/ML IJ SOLN
INTRAMUSCULAR | Status: AC
  Filled 2012-06-28: qty 1

## 2012-06-28 MED ORDER — SODIUM CHLORIDE 0.9 % IV SOLN
INTRAVENOUS | Status: DC
  Administered 2012-06-29: 04:00:00 via INTRAVENOUS

## 2012-06-28 MED ORDER — HEPARIN (PORCINE) IN NACL 2-0.9 UNIT/ML-% IJ SOLN
INTRAMUSCULAR | Status: AC
  Filled 2012-06-28: qty 1000

## 2012-06-28 MED ORDER — SODIUM CHLORIDE 0.9 % IJ SOLN: 3.0000 mL | INTRAMUSCULAR | Status: DC | PRN

## 2012-06-28 MED ORDER — SODIUM CHLORIDE 0.9 % IV SOLN: 250.0000 mL | INTRAVENOUS | Status: DC | PRN

## 2012-06-28 MED ORDER — SODIUM CHLORIDE 0.9 % IV SOLN
INTRAVENOUS | Status: DC
  Administered 2012-06-28: 18:00:00 via INTRAVENOUS

## 2012-06-28 MED ORDER — LIDOCAINE HCL (PF) 1 % IJ SOLN
INTRAMUSCULAR | Status: AC
  Filled 2012-06-28: qty 30

## 2012-06-28 MED ORDER — VERAPAMIL HCL 2.5 MG/ML IV SOLN
INTRAVENOUS | Status: AC
  Filled 2012-06-28: qty 2

## 2012-06-28 MED ORDER — ONDANSETRON HCL 4 MG/2ML IJ SOLN: 4.0000 mg | Freq: Four times a day (QID) | INTRAMUSCULAR | Status: DC | PRN

## 2012-06-28 MED ORDER — ACETAMINOPHEN 325 MG PO TABS
650.0000 mg | ORAL_TABLET | ORAL | Status: DC | PRN
  Administered 2012-06-29 – 2012-06-30 (×2): 650 mg via ORAL
  Filled 2012-06-28 (×2): qty 2

## 2012-06-28 MED ORDER — PRASUGREL HCL 10 MG PO TABS
60.0000 mg | ORAL_TABLET | Freq: Once | ORAL | Status: AC
  Administered 2012-06-28: 60 mg via ORAL
  Filled 2012-06-28: qty 6

## 2012-06-28 MED ORDER — RAMIPRIL 10 MG PO CAPS
10.0000 mg | ORAL_CAPSULE | Freq: Every day | ORAL | Status: DC
  Administered 2012-06-28 – 2012-06-30 (×3): 10 mg via ORAL
  Filled 2012-06-28 (×5): qty 1

## 2012-06-28 MED ORDER — METOPROLOL SUCCINATE ER 25 MG PO TB24
25.0000 mg | ORAL_TABLET | Freq: Every day | ORAL | Status: DC
  Administered 2012-06-29 – 2012-06-30 (×2): 25 mg via ORAL
  Filled 2012-06-28 (×3): qty 1

## 2012-06-28 MED ORDER — ASPIRIN 325 MG PO TABS
325.0000 mg | ORAL_TABLET | Freq: Every day | ORAL | Status: DC
Start: 2012-06-29 — End: 2012-06-29
  Filled 2012-06-28: qty 1

## 2012-06-28 MED ORDER — ASPIRIN 81 MG PO CHEW
324.0000 mg | CHEWABLE_TABLET | ORAL | Status: AC
  Administered 2012-06-29: 324 mg via ORAL
  Filled 2012-06-28: qty 1
  Filled 2012-06-28: qty 3

## 2012-06-28 MED ORDER — HEPARIN SODIUM (PORCINE) 5000 UNIT/ML IJ SOLN
5000.0000 [IU] | Freq: Three times a day (TID) | INTRAMUSCULAR | Status: DC
  Filled 2012-06-28 (×2): qty 1

## 2012-06-28 MED ORDER — DIAZEPAM 5 MG PO TABS
5.0000 mg | ORAL_TABLET | ORAL | Status: AC
  Administered 2012-06-28: 5 mg via ORAL
  Filled 2012-06-28: qty 1

## 2012-06-28 MED ORDER — MORPHINE SULFATE 2 MG/ML IJ SOLN: 1.0000 mg | INTRAMUSCULAR | Status: DC | PRN

## 2012-06-28 MED ORDER — NITROGLYCERIN 1 MG/10 ML FOR IR/CATH LAB
INTRA_ARTERIAL | Status: AC
  Filled 2012-06-28: qty 10

## 2012-06-28 MED ORDER — ATORVASTATIN CALCIUM 20 MG PO TABS
20.0000 mg | ORAL_TABLET | Freq: Every day | ORAL | Status: DC
  Administered 2012-06-28 – 2012-06-29 (×2): 20 mg via ORAL
  Filled 2012-06-28 (×3): qty 1

## 2012-06-28 NOTE — CV Procedure (Signed)
[facility] CARDIAC CATHETERIZATION REPORT  NAME:  [name]   MRN: [id number] DOB:  [birth year]   ADMIT DATE: 06/28/2012 Procedure Date: 06/28/2012  INTERVENTIONAL CARDIOLOGIST: Marykay Lex, M.D., MS PRIMARY CARE PROVIDER: Carney Living, MD PRIMARY CARDIOLOGIST:  Marykay Lex, M.D., MS  PATIENT:  [name] is a 57 y.o. male with PMH of RCA PCI with 2 overlapping Cypher DES for ACS in 2007.  Was seen in the office with atypical anginal symptoms and referred for noninvasive Cardiolite ST taht demonstrated HIGH RISK findings with significant Inferior-Inferolateral Ischemia.  He has remained relatively stable with Class II Angina (but has not returned to his routine activities.  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  Consent for procedure obtained. Consent for signed by MD and patient with RN witness -- placed on chart.  PROCEDURE: The patient was brought to the 2nd Floor [facility] in the fasting state and prepped and draped in the usual sterile fashion for Right groin or radial access. A modified Allen's test with plethysmography was performed, revealing excellent Ulnar artery collateral flow.  Sterile technique was used including antiseptics, cap, gloves, gown, hand hygiene, mask and sheet.  Skin prep: Chlorhexidine.  Time Out: Verified patient identification, verified procedure, site/side was marked, verified correct patient position, special equipment/implants available, medications/allergies/relevent history reviewed, required imaging and test  results available.  Performed  Access: Right Radial Artery; 5 Fr Glide Sheath -- Seldinger technique using Angiocath Micropuncture Kit  Radial Cocktail, IV Heparin administered Diagnostic: 5 Fr TIG 4.0, Angled Pigtail advanced over Versicore wire - exchanged over long exchange safety J-wire.  Left & Right Coronary Artery Angiography: TIG 4.0  LV Hemodynamics (LV Gram): Angled Pigtail catheter  TR Band:  100 Hours, 12 mL air  MEDICATIONS:  Anesthesia:  Local Lidocaine 2 ml  Sedation:  2 mg IV Versed, 25 mcg IV fentanyl ;   Premedication: 5 mg PO Valium  Omnipaque Contrast: 90 ml  Anticoagulation:  IV Heparin 3500 Units   Hemodynamics:  Central Aortic / Mean Pressures: 114/69 mmHg; 88 mmHg  Left Ventricular Pressures / EDP: 115/8 mmHg; 12 mmHg  Left Ventriculography:  EF: 60-65 %  Wall Motion: normal  Coronary Anatomy:  Left Main: Large caliber, trifurcates into Ramus, Circumflex and LAD LAD: Moderate to large caliber vessel until it bifurcated gaits into the distal LAD and major diagonal branch. There is a small area of irregularity proximal to the diagonal, but distal to the diagonal is a focal 70% lesion followed by napkin ringing 90-95% focal lesion before the vessel tapers down as a small caliber vessel around the apex.  D1: Moderate caliber vessel at least as big as a follow on LAD, mild luminal irregularities.  Left Circumflex: Moderate caliber vessel which gives off a small AV groove circumflex with small posterior lateral branches and a smaller moderate caliber lateral OM branch.  OM1: Moderate caliber vessel results in the apex. There is a diffuse 70% irregular region in the proximal vessel. The downstream vessel actually good target with no significant lesions.  Ramus intermedius: Small to Moderate caliber vessel with proximal 80% stenosis.  This is not in significant but it is relatively small vessel that is not a very good PCI target.    RCA: Large  caliber, dominant vessel. There is diffuse disease starting in a very proximal segment that becomes progressively worse from 40-60% followed by a focal 95-99% near subtotal occlusion. This is followed by a short somewhat ectatic segment in a focal 60% irregular lesion prior to RV marginal branch. The vessel then continues on is relatively normal vessel with a widely patent stents in the distal portion prior to the bifurcation into the RPDA and  Right Posterior AV Groove Branch (RPAV)  RPDA: Moderate caliber vessel with diffuse mild luminal irregularity is. Reaches down to the apex.  RPL Sysytem:The RPAV moderate caliber vessel with diffuse luminal irregularities of gives off one major RPL and several smaller RPL's  PATIENT DISPOSITION:    The patient was transferred to the PACU holding area in a hemodynamicaly stable, chest pain free condition.  The patient tolerated the procedure well, and there were no complications.  EBL:   < 10 ml  The patient was stable before, during, and after the procedure.  POST-OPERATIVE DIAGNOSIS:    Multivessel CAD with Severe 90-95% mid RCA and mid LAD lesions with ~70-80% Ramus & OM1 lesions.  High Risk Cardiolite with significant Inferior Ischemia  Patent distal RCA Cypher DES   Preserved EF with normal LVEDP  PLAN OF CARE:  CT Surgical consult to decide between CABG for 4 Vessel CAD vs. PCI of RCA & LAD +/- Ramus & Circ-OM.  Keep as Observation overnight, but would need revascularization prior to discharge.  As he is not having active symptoms, will not anticoagulate with Heparin.  Restart home medications.  Greater than 45 minutes spent with the patient. Additional 20-25 minutes spent discussing the procedure and results with the patient and family post procedure.  Marykay Lex, M.D., M.S. [facility] [address] [place], [place]  [address]  [phone]  06/28/2012 10:13 AM

## 2012-06-28 NOTE — Progress Notes (Signed)
I appreciate Dr. Dennie Maizes consult. I just spent over 30 min with the pt & his wife to discuss options of CABG vs. PCI.  He has heard both options & has decided that he would prefer to avoid CABG & is OK with "incomplete revascularization" of the RCA & LAD with PCI.   I ensured him that CABG may well be the best overall option for revascularization, he understands, & but would prefer the percutaneous option.  We will plan for PCI of RCA with FFR-LAD & probable PCI in the AM.  Would initially plan to treat Ramus & OM medically.  Will make NPO after MN.  Load with Effient tonite.  Marykay Lex, M.D., M.S. [facility] [address] [place], [place]  [address]  [phone] Pager # [phone] 06/28/2012 5:29 PM

## 2012-06-28 NOTE — H&P (Signed)
History and Physical Interval Note:  NAME:  [name]   MRN: [id number] DOB:  Jun 14, 1955   ADMIT DATE: 06/28/2012   06/28/2012 7:42 AM  [name] is a 57 y.o. male with PMH below presenting for cardiac catheterization to investigate a HIGH RISK Cardiolite ST performed to investigate bilateral arm pain.   He was seen in clinic last week, to discuss the results and further options of care.   Past Medical History  Diagnosis Date  . CAD (coronary artery disease), native coronary artery Sept 2007    Inf MI - distal RAC 90% - PCI; 60% rPDA; LCA minimal disease; Normal EF without WMA  . Presence of drug coated stent in right coronary artery     Cypher DES 3.0 mm x 13, 3.0 mm x 18 mm overlapping;  . HTN (hypertension)   . Dyslipidemia, goal LDL below 70    Past Surgical History  Procedure Laterality Date  . Percutaneous coronary stent intervention (pci-s) Right 10/2005    Distal RCA: Cypher DES 3.0 mm x 13mm, 3.0 mm x 18 mm overlapping;  . Cataract surgery  1991 and 1992    both eyes    FAMHx: No family history on file.  SOCHx:  has no tobacco, alcohol, and drug history on file.  ALLERGIES: Allergies  Allergen Reactions  . Simvastatin Other (See Comments)    Unspecified.    HOME MEDICATIONS: Prescriptions prior to admission  Medication Sig Dispense Refill  . aspirin 325 MG tablet Take 325 mg by mouth daily.        [name] atorvastatin (LIPITOR) 20 MG tablet Take 1 tablet (20 mg total) by mouth daily.  90 tablet  3  . diazepam (VALIUM) 5 MG tablet Take 5 mg by mouth once.      . Lactobacillus (ACIDOPHILUS PO) Take 1 capsule by mouth daily.      . metoprolol (TOPROL-XL) 25 MG 24 hr tablet Take 25 mg by mouth daily.        . ramipril (ALTACE) 10 MG capsule Take 10 mg by mouth daily.          PHYSICAL EXAM:Blood pressure 136/77, pulse 63, temperature 97.1 F (36.2 C), temperature source Oral, resp. rate 18, height 5\' 5"  (1.651 m), weight 65.772 kg (145 lb), SpO2 98.00%. No  significant change from dictated clinic note.  IMPRESSION & PLAN The patients' history has been reviewed, patient examined, no change in status from most recent note, stable for surgery. I have reviewed the patients' chart and labs. Questions were answered to the patient's satisfaction.    [name] has presented today for surgery, with the diagnosis of chest pain The various methods of treatment have been discussed with the patient and family.   Risks / Complications include, but not limited to: Death, MI, CVA/TIA, VF/VT (with defibrillation), Bradycardia (need for temporary pacer placement), contrast induced nephropathy, bleeding / bruising / hematoma / pseudoaneurysm, vascular or coronary injury (with possible emergent CT or Vascular Surgery), adverse medication reactions, infection.     After consideration of risks, benefits and other options for treatment, the patient has consented to Procedure(s):  LEFT HEART CATHETERIZATION AND CORONARY ANGIOGRAPHY +/- AD HOC PERCUTANEOUS CORONARY INTERVENTION  as a surgical intervention.   We will proceed with the planned procedure.   Jamari Diana W [facility] [address] [place], [place]  [address]  [phone]  06/28/2012 7:42 AM

## 2012-06-28 NOTE — Progress Notes (Signed)
Patient ID: [name], male   DOB: 1955/10/18, 57 y.o.   MRN: [id number]                 [address]          [place] [address]       [phone]       SHELDEN RABORN [facility] Doctor [facility] Medical Record #[id number] Date of Birth: 11/17/1955  Referring: Dr Ranae Palms Primary Care: Carney Living, MD  Chief Complaint:   Arm pain and positive stress test   History of Present Illness:    Patient is 57 year old male with a strong family history of premature coronary artery disease with his father who died of acute myocardial infarction at age 9 and his brother who's had coronary stents placed at age 19.  At age 89 the patient had stents placed in his right coronary artery. He done well until approximately 2 months ago when he had recurrent arm discomfort almost always with exertion. On his routine visit with Dr. Herbie Baltimore he mentioned his symptoms. A stress test was performed. He is now admitted to investigate a HIGH RISK Cardiolite ST performed to investigate bilateral arm pain.  Patient was brought in the hospital today for cardiac catheterization    Current Activity/ Functional Status: Patient is independent with mobility/ambulation, transfers, ADL's, IADL's.   Zubrod Score: At the time of surgery this patient's most appropriate activity status/level should be described as: []  Normal activity, no symptoms [x]  Symptoms, fully ambulatory []  Symptoms, in bed less than or equal to 50% of the time []  Symptoms, in bed greater than 50% of the time but less than 100% []  Bedridden []  Moribund  Past Medical History  Diagnosis Date  . CAD (coronary artery disease), native coronary artery Sept 2007    Inf MI - distal RAC 90% - PCI; 60% rPDA; LCA minimal disease; Normal EF without WMA  . Presence of drug coated stent in right coronary artery     Cypher DES 3.0 mm x 13, 3.0 mm x 18 mm overlapping;  . HTN (hypertension)   . Dyslipidemia, goal LDL below 70     Past  Surgical History  Procedure Laterality Date  . Percutaneous coronary stent intervention (pci-s) Right 10/2005    Distal RCA: Cypher DES 3.0 mm x 13mm, 3.0 mm x 18 mm overlapping;  . Cataract surgery  1991 and 1992    both eyes    History  Smoking status  . Former Smoker  . Types: Cigarettes  . Quit date: 02/23/1992  Smokeless tobacco  . Never Used    History  Alcohol Use No    History   Social History  . Marital Status: Married    Spouse Name: N/A    Number of Children: 0  . Years of Education: N/A   Occupational History  . works at [employer]   Social History Main Topics  . Smoking status: Former Smoker    Types: Cigarettes    Quit date: 02/23/1992  . Smokeless tobacco: Never Used  . Alcohol Use: No  . Drug Use: No  . Sexually Active: Not on file      Allergies  Allergen Reactions  . Simvastatin Other (See Comments)    Unspecified.    Current Facility-Administered Medications  Medication Dose Route Frequency Provider Last Rate Last Dose  . 0.9 %  sodium chloride infusion  1 mL/kg/hr Intravenous Continuous Marykay Lex, MD 65.8 mL/hr at 06/28/12 1045 1 mL/kg/hr  at 06/28/12 1045  . 0.9 %  sodium chloride infusion  250 mL Intravenous PRN Marykay Lex, MD      . acetaminophen (TYLENOL) tablet 650 mg  650 mg Oral Q4H PRN Marykay Lex, MD      . Melene Muller ON 06/29/2012] aspirin tablet 325 mg  325 mg Oral Daily Marykay Lex, MD      . atorvastatin (LIPITOR) tablet 20 mg  20 mg Oral q1800 Marykay Lex, MD      . heparin injection 5,000 Units  5,000 Units Subcutaneous Q8H Marykay Lex, MD      . Melene Muller ON 06/29/2012] metoprolol succinate (TOPROL-XL) 24 hr tablet 25 mg  25 mg Oral Daily Marykay Lex, MD      . morphine 2 MG/ML injection 1 mg  1 mg Intravenous Q1H PRN Marykay Lex, MD      . ondansetron [facility]) injection 4 mg  4 mg Intravenous Q6H PRN Marykay Lex, MD      . ramipril (ALTACE) capsule 10 mg  10 mg Oral Daily Marykay Lex,  MD   10 mg at 06/28/12 1259  . sodium chloride 0.9 % injection 3 mL  3 mL Intravenous Q12H Marykay Lex, MD      . sodium chloride 0.9 % injection 3 mL  3 mL Intravenous PRN Marykay Lex, MD        Prescriptions prior to admission  Medication Sig Dispense Refill  . aspirin 325 MG tablet Take 325 mg by mouth daily.        [name] atorvastatin (LIPITOR) 20 MG tablet Take 1 tablet (20 mg total) by mouth daily.  90 tablet  3  . diazepam (VALIUM) 5 MG tablet Take 5 mg by mouth once.      . Lactobacillus (ACIDOPHILUS PO) Take 1 capsule by mouth daily.      . metoprolol (TOPROL-XL) 25 MG 24 hr tablet Take 25 mg by mouth daily.        . ramipril (ALTACE) 10 MG capsule Take 10 mg by mouth daily.          Family History  Problem Relation Age of Onset  . Heart attack Father 44    died of acute mi  . Coronary artery disease Brother 50    stents placed     Review of Systems:     Cardiac Review of Systems: Y or N  Chest Pain [  Arm pain  ]  Resting SOB [ n  ] Exertional SOB  [  n]  Orthopnea [ n ]   Pedal Edema [n   ]    Palpitations [n  ] Syncope  Milo.Brash  ]   Presyncope [ n  ]  General Review of Systems: [Y] = yes [  ]=no Constitional: recent weight change [ n ]; anorexia [  ]; fatigue [ n ]; nausea [  ]; night sweats [n  ]; fever [ n ]; or chills [ n ]                                                               Dental: poor dentition[ n ]; Last Dentist visit:   Eye : blurred vision [  ]; diplopia [   ];  vision changes [ y cataracts ];  Amaurosis fugax[n  ]; Resp: cough [  ];  wheezing[  ];  hemoptysis[  ]; shortness of breath[  ]; paroxysmal nocturnal dyspnea[  ]; dyspnea on exertion[  ]; or orthopnea[  ];  GI:  gallstones[  ], vomiting[  ];  dysphagia[  ]; melena[  ];  hematochezia [  ]; heartburn[ n ];   Hx of  Colonoscopy[n  ]; GU: kidney stones [  ]; hematuria[  ];   dysuria [  ];  nocturia[  ];  history of     obstruction [  ]; urinary frequency [  ]             Skin: rash, swelling[  ];,  hair loss[  ];  peripheral edema[  ];  or itching[  ]; Musculosketetal: myalgias[  ];  joint swelling[  ];  joint erythema[  ];  joint pain[  ];  back pain[  ];  Heme/Lymph: bruising[  ];  bleeding[  ];  anemia[  ];  Neuro: TIA[ n ];  headaches[  ];  stroke[  ];  vertigo[  ];  seizures[  ];   paresthesias[  ];  difficulty walking[  ];  Psych:depression[  ]; anxiety[  ];  Endocrine: diabetes[ n ];  thyroid dysfunction[  ];  Immunizations: Flu [ y ]; Pneumococcal[ n ];  Other:  Physical Exam: BP 165/75  Pulse 58  Temp(Src) 97.1 F (36.2 C) (Oral)  Resp 18  Ht 5\' 5"  (1.651 m)  Wt 145 lb (65.772 kg)  BMI 24.13 kg/m2  SpO2 98%  General appearance: alert, cooperative, appears older than stated age and no distress Neurologic: intact Heart: regular rate and rhythm, S1, S2 normal, no murmur, click, rub or gallop and normal apical impulse Lungs: clear to auscultation bilaterally and normal percussion bilaterally Abdomen: soft, non-tender; bowel sounds normal; no masses,  no organomegaly Extremities: extremities normal, atraumatic, no cyanosis or edema and Homans sign is negative, no sign of DVT Wound: Patient's right radial cath site is without hematoma Patient has no carotid bruits no cervical or supraclavicular adenopathy Full and equal DP and PT pulses bilaterally Appears to have adequate vein for bypass in the lower extremities  Diagnostic Studies & Laboratory data:     Recent Radiology Findings:  No results found.   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 Consent for procedure obtained.  Consent for signed by MD and patient with RN witness -- placed on chart.  PROCEDURE: The patient was brought to the 2nd Floor [place] [facility] in the fasting state and prepped and draped in the usual sterile fashion for Right groin or radial access. A modified Allen's test with plethysmography was performed, revealing  excellent Ulnar artery collateral flow. Sterile technique was used including antiseptics, cap, gloves, gown, hand hygiene, mask and sheet. Skin prep: Chlorhexidine.  Time Out: Verified patient identification, verified procedure, site/side was marked, verified correct patient position, special equipment/implants available, medications/allergies/relevent history reviewed, required imaging and test results available. Performed  Access: Right Radial Artery; 5 Fr Glide Sheath -- Seldinger technique using Angiocath Micropuncture Kit  Radial Cocktail, IV Heparin administered Diagnostic: 5 Fr TIG 4.0, Angled Pigtail advanced over Versicore wire - exchanged over long exchange safety J-wire.  Left & Right Coronary Artery Angiography: TIG 4.0  LV Hemodynamics (LV Gram): Angled Pigtail catheter TR Band: 100 Hours, 12 mL air  MEDICATIONS:  Anesthesia: Local Lidocaine 2 ml Sedation: 2 mg IV Versed, 25 mcg IV fentanyl ;  Premedication: 5 mg PO Valium Omnipaque Contrast: 90 ml  Anticoagulation: IV Heparin 3500 Units  Hemodynamics:  Central Aortic / Mean Pressures: 114/69 mmHg; 88 mmHg  Left Ventricular Pressures / EDP: 115/8 mmHg; 12 mmHg Left Ventriculography:  EF: 60-65 %  Wall Motion: normal Coronary Anatomy:  Left Main: Large caliber,  trifurcates into Ramus, Circumflex and LAD LAD: Moderate to large caliber vessel until it bifurcated gaits into the distal LAD and major diagonal branch. There is a small area of irregularity proximal to the diagonal, but distal to the diagonal is a focal 70% lesion followed by napkin ringing 90-95% focal lesion before the vessel tapers down as a small caliber vessel around the apex.  D1: Moderate caliber vessel at least as big as a follow on LAD, mild luminal irregularities. Left Circumflex: Moderate caliber vessel which gives off a small AV groove circumflex with small posterior lateral branches and a smaller moderate caliber lateral OM branch.  OM1: Moderate caliber vessel results in the apex. There is a diffuse 70% irregular region in the proximal vessel. The downstream vessel actually good target with no significant lesions. Ramus intermedius: Small to Moderate caliber vessel with proximal 80% stenosis. This is not in significant but it is relatively small vessel that is not a very good PCI target.  RCA: Large caliber, dominant vessel. There is diffuse disease starting in a very proximal segment that becomes progressively worse from 40-60% followed by a focal 95-99% near subtotal occlusion. This is followed by a short somewhat ectatic segment in a focal 60% irregular lesion prior to RV marginal branch. The vessel then continues on is relatively normal vessel with a widely patent stents in the distal portion prior to the bifurcation into the RPDA and Right Posterior AV Groove Branch (RPAV)  RPDA: Moderate caliber vessel with diffuse mild luminal irregularity is. Reaches down to the apex.  RPL Sysytem:The RPAV moderate caliber vessel with diffuse luminal irregularities of gives off one major RPL and several smaller RPL's PATIENT DISPOSITION:  The patient was transferred to the PACU holding area in a hemodynamicaly stable, chest pain free condition.  The patient tolerated the procedure well, and there  were no complications. EBL: < 10 ml  The patient was stable before, during, and after the procedure. POST-OPERATIVE DIAGNOSIS:  Multivessel CAD with Severe 90-95% mid RCA and mid LAD lesions with ~70-80% Ramus & OM1 lesions.  High Risk Cardiolite with significant Inferior Ischemia  Patent distal RCA Cypher DES  Preserved EF with normal LVEDP PLAN OF CARE:  CT Surgical consult to decide between CABG for 4 Vessel CAD vs. PCI of RCA & LAD +/- Ramus & Circ-OM.  Keep as Observation overnight, but would need revascularization prior to discharge.  As he is not having active symptoms, will not anticoagulate with Heparin.  Restart home medications. Greater than 45 minutes spent with the patient. Additional 20-25 minutes spent discussing the procedure and results with the patient and family post procedure.  Marykay Lex, M.D., M.S.    Assessment / Plan:     Patient is a 57 year old male who now presents with rapidly progressing recurrent anginal symptoms manifested by arm pain with positive stress test and now found to have three-vessel coronary artery disease with high-grade right coronary lesion and also LAD ramus and OM disease. He has preserved LV function. With his recurrent symptoms and three-vessel coronary disease I recommended proceeding with coronary artery bypass grafting to the patient as the best option for long-term treatment. Risks and options were discussed with the patient and his wife in detail. He is considering his options but would like to further discuss with Dr. Herbie Baltimore possibility of angioplasty, as a treatment option.( More than 60 minutes was spent face-to-face with the patient and his wife reviewing history findings and cardiac catheterization results and options) Tentatively we could proceed on Friday, May 9 if the patient wishes to proceed.     Delight Ovens MD  Beeper [phone] Office [phone] 06/28/2012 2:34 PM

## 2012-06-28 NOTE — Progress Notes (Signed)
Utilization Review Completed Criston Chancellor J. Doniesha Landau, RN, BSN, NCM [phone]  

## 2012-06-29 ENCOUNTER — Encounter (HOSPITAL_COMMUNITY): Payer: Self-pay | Admitting: Cardiology

## 2012-06-29 ENCOUNTER — Encounter (HOSPITAL_COMMUNITY): Payer: BC Managed Care – PPO

## 2012-06-29 ENCOUNTER — Encounter (HOSPITAL_COMMUNITY)
Admission: RE | Disposition: A | Discharge status: Home / Self Care | Payer: Self-pay | Source: Ambulatory Visit | Attending: Cardiology

## 2012-06-29 DIAGNOSIS — R9439 Abnormal result of other cardiovascular function study: Secondary | ICD-10-CM | POA: Diagnosis not present

## 2012-06-29 DIAGNOSIS — I2510 Atherosclerotic heart disease of native coronary artery without angina pectoris: Secondary | ICD-10-CM

## 2012-06-29 DIAGNOSIS — I209 Angina pectoris, unspecified: Secondary | ICD-10-CM | POA: Diagnosis present

## 2012-06-29 DIAGNOSIS — Z9861 Coronary angioplasty status: Secondary | ICD-10-CM

## 2012-06-29 DIAGNOSIS — I10 Essential (primary) hypertension: Secondary | ICD-10-CM | POA: Diagnosis not present

## 2012-06-29 DIAGNOSIS — I200 Unstable angina: Secondary | ICD-10-CM | POA: Diagnosis not present

## 2012-06-29 HISTORY — DX: Atherosclerotic heart disease of native coronary artery without angina pectoris: I25.10

## 2012-06-29 HISTORY — PX: PERCUTANEOUS CORONARY STENT INTERVENTION (PCI-S): SHX5485

## 2012-06-29 HISTORY — DX: Coronary angioplasty status: Z98.61

## 2012-06-29 LAB — BASIC METABOLIC PANEL
BUN: 15 mg/dL (ref 6–23)
CO2: 21 mEq/L (ref 19–32)
GFR calc non Af Amer: >90 mL/min (ref >90)
Glucose, Bld: 106 mg/dL — ABNORMAL HIGH (ref 70–99)
Potassium: 4 mEq/L (ref 3.5–5.1)
Sodium: 142 mEq/L (ref 135–145)

## 2012-06-29 LAB — PROTIME-INR: INR: 1.08 (ref 0.00–1.49)

## 2012-06-29 LAB — CBC
HCT: 42.3 % (ref 39.0–52.0)
Hemoglobin: 14.6 g/dL (ref 13.0–17.0)
MCV: 90.6 fL (ref 78.0–100.0)
RBC: 4.67 MIL/uL (ref 4.22–5.81)
RDW: 13.1 % (ref 11.5–15.5)
WBC: 8.4 10*3/uL (ref 4.0–10.5)

## 2012-06-29 LAB — POCT ACTIVATED CLOTTING TIME: Activated Clotting Time: 502 seconds

## 2012-06-29 SURGERY — PERCUTANEOUS CORONARY STENT INTERVENTION (PCI-S)
Anesthesia: LOCAL

## 2012-06-29 MED ORDER — HYDRALAZINE HCL 20 MG/ML IJ SOLN
10.0000 mg | Freq: Once | INTRAMUSCULAR | Status: AC
  Administered 2012-06-29: 17:00:00 10 mg via INTRAVENOUS
  Filled 2012-06-29: qty 1

## 2012-06-29 MED ORDER — VERAPAMIL HCL 2.5 MG/ML IV SOLN
INTRAVENOUS | Status: AC
  Filled 2012-06-29: qty 2

## 2012-06-29 MED ORDER — LIDOCAINE HCL (PF) 1 % IJ SOLN
INTRAMUSCULAR | Status: AC
  Filled 2012-06-29: qty 30

## 2012-06-29 MED ORDER — BIVALIRUDIN 250 MG IV SOLR
INTRAVENOUS | Status: AC
  Filled 2012-06-29: qty 250

## 2012-06-29 MED ORDER — SODIUM CHLORIDE 0.9 % IV SOLN
1.7500 mg/kg/h | INTRAVENOUS | Status: AC
  Administered 2012-06-29: 1.75 mg/kg/h via INTRAVENOUS
  Filled 2012-06-29: qty 250

## 2012-06-29 MED ORDER — ADENOSINE 12 MG/4ML IV SOLN
12.0000 mL | Freq: Once | INTRAVENOUS | Status: AC
  Administered 2012-06-29: 36 mg via INTRAVENOUS
  Filled 2012-06-29: qty 12

## 2012-06-29 MED ORDER — SODIUM CHLORIDE 0.9 % IJ SOLN: 3.0000 mL | INTRAMUSCULAR | Status: DC | PRN

## 2012-06-29 MED ORDER — HEPARIN (PORCINE) IN NACL 2-0.9 UNIT/ML-% IJ SOLN
INTRAMUSCULAR | Status: AC
  Filled 2012-06-29: qty 1000

## 2012-06-29 MED ORDER — SODIUM CHLORIDE 0.9 % IV SOLN: 1.0000 mL/kg/h | INTRAVENOUS | Status: AC

## 2012-06-29 MED ORDER — DIAZEPAM 5 MG PO TABS
5.0000 mg | ORAL_TABLET | Freq: Once | ORAL | Status: AC
  Administered 2012-06-29: 12:00:00 5 mg via ORAL
  Filled 2012-06-29: qty 1

## 2012-06-29 MED ORDER — MIDAZOLAM HCL 2 MG/2ML IJ SOLN
INTRAMUSCULAR | Status: AC
  Filled 2012-06-29: qty 2

## 2012-06-29 MED ORDER — ASPIRIN 81 MG PO CHEW
81.0000 mg | CHEWABLE_TABLET | Freq: Every day | ORAL | Status: DC
  Administered 2012-06-30: 10:00:00 81 mg via ORAL
  Filled 2012-06-29: qty 1

## 2012-06-29 MED ORDER — FENTANYL CITRATE 0.05 MG/ML IJ SOLN
INTRAMUSCULAR | Status: AC
  Filled 2012-06-29: qty 2

## 2012-06-29 MED ORDER — SODIUM CHLORIDE 0.9 % IV SOLN: 250.0000 mL | INTRAVENOUS | Status: DC | PRN

## 2012-06-29 MED ORDER — NITROGLYCERIN 1 MG/10 ML FOR IR/CATH LAB
INTRA_ARTERIAL | Status: AC
  Filled 2012-06-29: qty 10

## 2012-06-29 MED ORDER — SODIUM CHLORIDE 0.9 % IJ SOLN: 3.0000 mL | Freq: Two times a day (BID) | INTRAMUSCULAR | Status: DC

## 2012-06-29 NOTE — Care Management Note (Signed)
    Page 1 of 1   06/29/2012     11:02:56 AM   CARE MANAGEMENT NOTE 06/29/2012  Patient:  TAL, KEMPKER   Account Number:  [id number]  Date Initiated:  06/29/2012  Documentation initiated by:  [facility]  Subjective/Objective Assessment:   57 y.o. male with PMH below presenting for cardiac catheterization to investigate a HIGH RISK Cardiolite ST performed to investigate bilateral arm pain.     Action/Plan:   cardiac cath/ benefits check for Effient 10mg  QD then home with spouse   Anticipated DC Date:  07/01/2012   Anticipated DC Plan:  HOME/SELF CARE      DC Planning Services  CM consult      Choice offered to / List presented to:             Status of service:  In process, will continue to follow Medicare Important Message given?   (If response is "NO", the following Medicare IM given date fields will be blank) Date Medicare IM given:   Date Additional Medicare IM given:    Discharge Disposition:    Per UR Regulation:    If discussed at Long Length of Stay Meetings, dates discussed:    Comments:  06/29/12 @ 1000.Marland KitchenMarland KitchenOletta Cohn, RN, BSN, [employer] [phone] Spoke with pt and wife at bedside concerning discharge planning and benefits check for Effient 10mg  daily.  Pt utilizes [facility] [address]  CM called pharmacy to insure medication in stock.  Benefits check currently pending- will inform pt when completed.

## 2012-06-29 NOTE — Progress Notes (Signed)
06/29/12 1600 Noted benefits check information, that pt. co-pay for Effient for 90day supply $192.00.  Spoke with pt. to make aware, however, pt. sleepy from procedure.  NCM will speak with pt. tomorrow.  Anticipate dc in am. Tera Mater, RN, BSN NCM [phone]

## 2012-06-29 NOTE — Progress Notes (Signed)
TR BAND REMOVAL  LOCATION:  right radial  DEFLATED PER PROTOCOL:  yes  TIME BAND OFF / DRESSING APPLIED:   1300   SITE UPON ARRIVAL:   Level 0  SITE AFTER BAND REMOVAL:  Level 0  REVERSE ALLEN'S TEST:    positive  CIRCULATION SENSATION AND MOVEMENT:  Within Normal Limits  yes  COMMENTS:    

## 2012-06-29 NOTE — CV Procedure (Signed)
[facility] PERCUTANEOUS CORONARY INTERVENTION REPORT  NAME:  [name]   MRN: [id number] DOB:  Jan 12, 1956   ADMIT DATE: 06/28/2012 Procedure Date: 06/29/2012  INTERVENTIONAL CARDIOLOGIST: Marykay Lex, M.D., MS PRIMARY CARE PROVIDER: Carney Living, MD PRIMARY CARDIOLOGIST: Marykay Lex,  M.D., MS  PATIENT:  [name] is a 57 y.o. male with PMH of RCA PCI with 2 overlapping Cypher DES for ACS in 2007. Was seen in the office with atypical anginal symptoms and referred for noninvasive Cardiolite ST taht demonstrated HIGH RISK findings with significant Inferior-Inferolateral Ischemia. He has remained relatively stable with Class II Angina (but has not returned to his routine activities).   He underwent diagnostic cardiac catheterization yesterday (06/28/12) revealing significant multivessel disease.  He was kept overnight for CT Surgical consultation last night & has remained stable.  After long deliberation and multiple discussions, he decided that he wanted to avoid CABG and wanted to proceed with multivessel PCI. He now presents for planned PCI of RCA and LAD as well as OM1 FFR +/- PCI.  PRE-OPERATIVE DIAGNOSIS:    Multivessel CAD with Severe RCA & LAD lesions, moderate to severe Circumflex-OM1 lesion.  PROCEDURES PERFORMED:    Percutaneous Coronary Intervention of the Proximal to Mid RCA sequential lesions with 2 overlapping Promus Premier DES (3.0 mm x 24 mm proximal - post dilated to 3.88 mm proximally; 3.5 mm x 18 mm distal -- 3.68 mm distal; with mid /overlapping segment dilation to 4.0 mm & 4.25 mm;   Final Stent Diameters   From proximal to distal: 3.52mm - 4.0 mm -- 4.25 mm -- 3.68 mm  Percutaneous Coronary Intervention of the LAD with a single Promus Premier 2.25 mm x 28 mm, post-dilated to 2.45 mm  Fractional Flow Reserve measurement of proximal to mid long lesion in OM1  PROCEDURE:Consent:  Risks of procedure as well as the  alternatives and risks of each were explained to the (patient/caregiver).  Consent for procedure obtained. Consent for signed by MD and patient with RN witness -- placed on chart.   PROCEDURE: The patient was brought to the 2nd Floor [facility] in the fasting state and prepped and draped in the usual sterile fashion for Right groin or radial access. A modified Allen's test with plethysmography was performed, revealing excellent Ulnar artery collateral flow.  Sterile technique was used including antiseptics, cap, gloves, gown, hand hygiene, mask and sheet.  Skin prep: Chlorhexidine.  Time Out: Verified patient identification, verified procedure, site/side was marked, verified correct patient position, special equipment/implants available, medications/allergies/relevent history reviewed, required imaging and test results available.  Performed  Access: Right Radial Artery; 6 Fr Sheath -- Seldinger technique with Angiocath Micropuncture Kit  Radial Cocktail, IV Angiomax Diagnostic:   Right Coronary Artery Angiography: 6 Fr JR4 Guide  Left Coronary Artery Angiography: 6 Fr XB LAD  TR Band:  1420 Hours, 16 mL air  MEDICATIONS:  Anesthesia:  Local Lidocaine 2 ml  Sedation:  2 mg IV Versed, 25 mcg IV fentanyl ;   Premedication: 5 mg PO Valium  Omnipaque Contrast: 220 ml  Anticoagulation:  Angiomax Bolus & drip  Anti-Platelet Agent:  60 mg Prasugrel load 5/7 --> 10 mg 5/8  Hemodynamics:  Central Aortic / Mean Pressures: 127/71 mmHg; 96 mmHg  Coronary Anatomy:  Left Main: Large caliber, trifurcates into Ramus, Circumflex and LAD LAD: Moderate to large caliber vessel until it bifurcated gaits into the distal LAD and major diagonal branch. There is  a small area of irregularity proximal to the diagonal, but distal to the diagonal is a focal 70% lesion followed by napkin ringing 90-95% focal lesion before the vessel tapers down as a small caliber vessel around  the apex.  D1: Moderate caliber vessel at least as big as a follow on LAD, mild luminal irregularities. Left Circumflex: Moderate caliber vessel which gives off a small AV groove circumflex with small posterior lateral branches and a smaller moderate caliber lateral OM branch.  OM1: Moderate caliber vessel results in the apex. There is a diffuse 70% irregular region in the proximal vessel. The downstream vessel actually good target with no significant lesions. Ramus intermedius: Small to Moderate caliber vessel with proximal 80% stenosis. This is not in significant but it is relatively small vessel that is not a very good PCI target.   RCA: Large caliber, dominant vessel. Diffuse disease starting in a proximal segment that becomes progressively worse from 40-60% followed by a focal 95-99% near subtotal occlusion. This is followed by a short somewhat ectatic segment in a focal 60% irregular lesion prior to RV marginal branch. The vessel then continues on is relatively normal vessel with a widely patent stents in the distal portion prior to the bifurcation into the RPDA and Right Posterior AV Groove Branch (RPAV).  RPDA: Moderate caliber vessel with diffuse mild luminal irregularities (~40% proximal is the most notable). The branch reaches down to the apex.  RPL Sysytem:The RPAV moderate caliber vessel with diffuse luminal irregularities of gives off one major RPL and several smaller RPL's  Percutaneous Coronary Intervention:   Lesion #1: Proximal to Mid RCA  Guide: 6 Fr   JR4 Guidewire: BMW Predilation Balloon: Trek 2.25 mm x 12 mm;   10 Atm x 30 Sec, 12 Atm x 30 Sec Stent #1 (Proximal): Promus Premier DES 3.0 mm x 28 mm;   14 Atm x 30 Sec,   Post-dilation with stent balloon --> 18 Atm x 45 Sec Post-dilation Balloon #1: [place] Quantum Apex 3.5 mm x 20 mm;   18 Atm x 45 Sec  - Final Diameter  Proximal ~3.88 mm Post-dilation Balloon #2: [place] Quantum Apex 4.0 mm x 20 mm;   10 Atm x 45 Sec (proximal  edge)  16 Atm x 45 Sec (distal edge) - Final Diameter  ~4.12 mm  Post deployment & dilation angiography revealed "dye hang-up" in the ectatic segment behind the distal stent edge.  Additionally, the ~50-60% lesion more distally appeared to be closer to ~70% & potentially significant.  For these 2 reasons, the decision was made to cover the distal lesion & the intervening ectatic segment with a 2nd overlapping stent.  Stent #2: Promus Premier 3.5 mm x 18 mm;   12Atm x 30 Sec; post-dilation with stent balloon --> 18 Atm x 45 Sec --> 3.6 mm Post-dilation Balloon: [place] Quantum Apex 4.0 mm x 20 mm;   18 Atm x 45 Sec  Final Diameter -- 3.68 mm distal; in stent overlap / ectatic segment ~4.3 mm  Post deployment angiography in multiple views, with and without guidewire in place revealed excellent stent deployment and lesion coverage.  There was no evidence of dissection or perforation.  Lesion #2: Mid LAD tandem 80% & 95% lesions  Guide: 6 Fr   XB LAD  Guidewire: BMW Predilation Balloon: Trek 2.25 mm x 12 mm;   6 Atm x 30 Sec, 8 Atm x 30 Sec -- obvious "dog-boning" effect noted as 95% lesion reduced to <10% Stent:  Promus Premier DES 2.25 mm x 28 mm;   12 Atm x 35 Sec  Post-dilation Balloon with stent balloon: 16 mm x 45 mm;   Final Diameter --> 2.45 mm  Post deployment angiography in multiple views, with and without guidewire in place revealed excellent stent deployment and lesion coverage.  There was no evidence of dissection or perforation.  Lesion #3: OM1 proximal to Mid ~70%  [place] Primewire FFR wire advanced beyond the lesion into a normal segment of vessel.    Baseline ration: 0.94 --> after 2 min Adenosine infusion --> 0.80 (high end of Grey Zone for physiological significance --> Plan Medical Management.  PATIENT DISPOSITION:    The patient was transferred to the PACU holding area in a hemodynamicaly stable, chest pain free condition.  The patient tolerated the procedure  well, and there were no complications.  EBL:   < 20 ml  The patient was stable before, during, and after the procedure.  POST-OPERATIVE DIAGNOSIS:    Successful 2 vessel PCI of Proximal to Mid RCA and Mid LAD with Promus Premier DES stents  Borderline significant FFR result of 0.80 on the long OM1 lesion  PLAN OF CARE:  Standard post radial catheterization PCI care.  Dual Antiplatelet therapy for minimum of 1 yr  Aggressive medical Rx & lifestyle modification for residual CAD  Anticipate d/c in AM.  Has appointment scheduled for 5/13 with me.   Marykay Lex, M.D., M.S. [facility] [address] [place], [place]  [address]  [phone]  06/29/2012 2:36 PM

## 2012-06-29 NOTE — Progress Notes (Signed)
Subjective: No complaints  Objective: Vital signs in last 24 hours: Temp:  [97.4 F (36.3 C)-98.3 F (36.8 C)] 98.3 F (36.8 C) (05/08 0817) Pulse Rate:  [58-72] 67 (05/08 0817) Resp:  [16] 16 (05/08 0817) BP: (142-167)/(66-83) 158/75 mmHg (05/08 0817) SpO2:  [95 %-100 %] 98 % (05/08 0817) Weight:  [66 kg (145 lb 8.1 oz)] 66 kg (145 lb 8.1 oz) (05/08 0636) Weight change:  Last BM Date: 06/28/12 Intake/Output from previous day: +935 05/07 0701 - 05/08 0700 In: 935.8 [P.O.:360; I.V.:575.8] Out: -  Intake/Output this shift:    PE: General:alert and oriented  X 3, MAE pleasant affect Heart:S1S2 RRR Lungs:clear without rales, rhonchi or wheezes Abd:+ BS, soft non tender Ext:rt. Radial  With + radial and ulna pulses, warm hand, no lower ext edema Neuro: grossly normal.  Normal mood & affect   Lab Results:  Recent Labs  06/28/12 1502 06/29/12 0510  WBC 6.0 8.4  HGB 14.2 14.6  HCT 40.5 42.3  PLT 147* 138*   BMET  Recent Labs  06/28/12 1502 06/29/12 0510  NA  --  142  K  --  4.0  CL  --  108  CO2  --  21  GLUCOSE  --  106*  BUN  --  15  CREATININE 0.81 0.74  CALCIUM  --  9.0   No results found for this basename: TROPONINI, CK, MB,  in the last 72 hours  Lab Results  Component Value Date   CHOL 184 01/14/2010   HDL 50 01/14/2010   LDLCALC 103* 01/14/2010   TRIG 153* 01/14/2010   CHOLHDL 3.7 Ratio 01/14/2010   No results found for this basename: HGBA1C     No results found for this basename: TSH    Studies/Results: Cardiac cath 06/28/12 POST-OPERATIVE DIAGNOSIS:  Multivessel CAD with Severe 90-95% mid RCA and mid LAD lesions with ~70-80% Ramus & OM1 lesions.  High Risk Cardiolite with significant Inferior Ischemia  Patent distal RCA Cypher DES  Preserved EF with normal LVEDP   Medications: I have reviewed the patient's current medications. Scheduled Meds: . aspirin  325 mg Oral Daily  . atorvastatin  20 mg Oral q1800  . metoprolol succinate  25 mg  Oral Daily  . prasugrel  10 mg Oral Daily  . ramipril  10 mg Oral Daily   Continuous Infusions: . sodium chloride 75 mL/hr at 06/29/12 0355   PRN Meds:.acetaminophen, morphine injection, ondansetron (ZOFRAN) IV  Assessment/Plan: Principal Problem:   Abnormal nuclear stress test - HIGH RISK; large area of  Inferior-inferoseptal ischemia Active Problems:   CAD (coronary artery disease), native coronary artery, now with severe 90-95% mid RCA and mid LAD lesions 70-80% for PCI   Presence of drug coated stent in right coronary artery   Angina, class II - III   HYPERCHOLESTEROLEMIA   HYPERTENSION  PLAN: Pt seen and evaluated by Dr. Tyrone Sage and follow up discussion with Dr. Herbie Baltimore to proceed with PCI of RCA with FFR-LAD & probable PCI.  Would initially plan to treat Ramus & OM medically.  Loaded with 60 mg Effient last pm.   No complaints today, ready for procedure/  LOS: 1 day   Time spent with pt. :15 minutes. Nada Boozer r Nurse Practitioner Certified Pager [phone] 06/29/2012, 10:16 AM  I have seen and evaluated the patient this AM along with Nada Boozer, NP. I agree with her findings, examination as well as impression recommendations.  Stable night.  BP a bit up from  baseline -- may be due to anxiety, will address as OP.  Loaded last PM with Effient for planned PCI today -- reviewed films with Drs. Dorothea Ogle who both agree with the plan, but would also consider evaluating the OM lesion, as this is also a major branch.  Will plan PCI RCA & LAD with FFR of Cx-OM1.  Performing MD:  Marykay Lex, M.D., M.S.  Procedure:  MULTIVESSEL PERCUTANEOUS CORONARY INTERVENTION  The procedure with Risks/Benefits/Alternatives and Indications was reviewed with the patient and his wife.  All questions were answered.    Risks / Complications include, but not limited to: Death, MI, CVA/TIA, VF/VT (with defibrillation), Bradycardia (need for temporary pacer placement), contrast induced  nephropathy, bleeding / bruising / hematoma / pseudoaneurysm, vascular or coronary injury (with possible emergent CT or Vascular Surgery), adverse medication reactions, infection.    The patient (and family) voice understanding and agree to proceed.   Consent signed.     Marykay Lex, M.D., M.S. [facility] [address] [place], [place]  [address]  [phone] Pager # [phone] 06/29/2012 10:17 AM

## 2012-06-30 ENCOUNTER — Encounter (HOSPITAL_COMMUNITY)
Admission: RE | Disposition: A | Discharge status: Home / Self Care | Payer: Self-pay | Source: Ambulatory Visit | Attending: Cardiology

## 2012-06-30 ENCOUNTER — Encounter (HOSPITAL_COMMUNITY): Payer: Self-pay | Admitting: Cardiology

## 2012-06-30 DIAGNOSIS — I2510 Atherosclerotic heart disease of native coronary artery without angina pectoris: Secondary | ICD-10-CM | POA: Diagnosis not present

## 2012-06-30 DIAGNOSIS — R9439 Abnormal result of other cardiovascular function study: Secondary | ICD-10-CM | POA: Diagnosis not present

## 2012-06-30 DIAGNOSIS — I200 Unstable angina: Secondary | ICD-10-CM | POA: Diagnosis not present

## 2012-06-30 DIAGNOSIS — I10 Essential (primary) hypertension: Secondary | ICD-10-CM | POA: Diagnosis not present

## 2012-06-30 LAB — BASIC METABOLIC PANEL
CO2: 24 mEq/L (ref 19–32)
Chloride: 107 mEq/L (ref 96–112)
Creatinine, Ser: 0.67 mg/dL (ref 0.50–1.35)
Potassium: 3.7 mEq/L (ref 3.5–5.1)

## 2012-06-30 LAB — CBC
MCV: 92.7 fL (ref 78.0–100.0)
Platelets: 132 10*3/uL — ABNORMAL LOW (ref 150–400)
RBC: 4.53 MIL/uL (ref 4.22–5.81)
WBC: 9.4 10*3/uL (ref 4.0–10.5)

## 2012-06-30 SURGERY — CORONARY ARTERY BYPASS GRAFTING (CABG)
Anesthesia: General | Site: Chest

## 2012-06-30 MED ORDER — PRASUGREL HCL 10 MG PO TABS: 10.0000 mg | ORAL_TABLET | Freq: Every day | ORAL | Status: DC

## 2012-06-30 MED ORDER — ASPIRIN 81 MG PO CHEW: 81.0000 mg | CHEWABLE_TABLET | Freq: Every day | ORAL | Status: DC

## 2012-06-30 MED FILL — Sodium Chloride IV Soln 0.9%: INTRAVENOUS | Qty: 50 | Status: AC

## 2012-06-30 NOTE — Discharge Summary (Signed)
Physician Discharge Summary  Patient ID: [name] MRN: [id number] DOB/AGE: 06-18-1955 57 y.o.  Admit date: 06/28/2012 Discharge date: 06/30/2012  Admission Diagnoses:  Abnormal NST  Discharge Diagnoses:  Principal Problem:   Abnormal nuclear stress test - HIGH RISK; large area of  Inferior-inferoseptal ischemia Active Problems:   HYPERCHOLESTEROLEMIA   HYPERTENSION   CAD (coronary artery disease), native coronary artery, now with severe 90-95% mid RCA and mid LAD lesions 70-80% for PCI   Presence of drug coated stent in right coronary artery   Angina, class II - III   Discharged Condition: stable  Hospital Course:   The patient is a 57 yo male with a past medical history of single-vessel coronary artery disease, status post PCI to the RCA for an inferior infarction in September 2007.  Myoview, August 2008: He exercised 14 METs and had no ischemia or infarction.  Followup Myoview for left arm pain and fatigue on June 13, 2012: Again, went 13.4 METs, 12 minutes, but there was significant reversible perfusion defect in the inferior and inferoseptal wall. High-risk nuclear stress test. Dyslipidemia, now back on atorvastatin.   He was brought in to [facility] for coronary angiography which revealed multivessel CAD with Severe 90-95% mid RCA and mid LAD lesions with ~70-80% Ramus & OM1 lesions.   Consult for coronary artery bypass grafting was completed and recommended by Dr. Tyrone Sage. After discussion with the patient he decided on PCI instead, although Dr. Herbie Baltimore ensured him that coronary bypass grafting was probably the best overall option.  PCI ws completed on 06/29/2012. (See cath note below).  Patient was started on aspirin and effient.  LV gram showed preserved EF 60-65% with normal wall motion.  .  The patient was seen by Dr. Herbie Baltimore who felt he was stable for DC home.    Consults: CTS  [facility] PCI 06/29/12 Significant Diagnostic Studies: Percutaneous Coronary Intervention:  Lesion #1:  Proximal to Mid RCA  Guide: 6 Fr JR4 Guidewire: BMW  Predilation Balloon: Trek 2.25 mm x 12 mm;  10 Atm x 30 Sec, 12 Atm x 30 Sec Stent #1 (Proximal): Promus Premier DES 3.0 mm x 28 mm;  14 Atm x 30 Sec,  Post-dilation with stent balloon --> 18 Atm x 45 Sec Post-dilation Balloon #1: [place] Quantum Apex 3.5 mm x 20 mm;  18 Atm x 45 Sec - Final Diameter Proximal ~3.88 mm Post-dilation Balloon #2: [place] Quantum Apex 4.0 mm x 20 mm;  10 Atm x 45 Sec (proximal edge)  16 Atm x 45 Sec (distal edge) - Final Diameter ~4.12 mm Post deployment & dilation angiography revealed "dye hang-up" in the ectatic segment behind the distal stent edge. Additionally, the ~50-60% lesion more distally appeared to be closer to ~70% & potentially significant. For these 2 reasons, the decision was made to cover the distal lesion & the intervening ectatic segment with a 2nd overlapping stent.  Stent #2: Promus Premier 3.5 mm x 18 mm;  12Atm x 30 Sec; post-dilation with stent balloon --> 18 Atm x 45 Sec --> 3.6 mm Post-dilation Balloon: [place] Quantum Apex 4.0 mm x 20 mm;  18 Atm x 45 Sec  Final Diameter -- 3.68 mm distal; in stent overlap / ectatic segment ~4.3 mm Post deployment angiography in multiple views, with and without guidewire in place revealed excellent stent deployment and lesion coverage. There was no evidence of dissection or perforation.  Lesion #2: Mid LAD tandem 80% & 95% lesions  Guide: 6 Fr XB LAD Guidewire:  BMW  Predilation Balloon: Trek 2.25 mm x 12 mm;  6 Atm x 30 Sec, 8 Atm x 30 Sec -- obvious "dog-boning" effect noted as 95% lesion reduced to <10% Stent: Promus Premier DES 2.25 mm x 28 mm;  12 Atm x 35 Sec Post-dilation Balloon with stent balloon: 16 mm x 45 mm;  Final Diameter --> 2.45 mm Post deployment angiography in multiple views, with and without guidewire in place revealed excellent stent deployment and lesion coverage. There was no evidence of dissection or perforation.  Lesion #3: OM1 proximal to  Mid ~70%  [place] Primewire FFR wire advanced beyond the lesion into a normal segment of vessel.  Baseline ration: 0.94 --> after 2 min Adenosine infusion --> 0.80 (high end of Grey Zone for physiological significance --> Plan Medical Management.  PATIENT DISPOSITION:  The patient was transferred to the PACU holding area in a hemodynamicaly stable, chest pain free condition.  The patient tolerated the procedure well, and there were no complications. EBL: < 20 ml  The patient was stable before, during, and after the procedure. POST-OPERATIVE DIAGNOSIS:  Successful 2 vessel PCI of Proximal to Mid RCA and Mid LAD with Promus Premier DES stents  Borderline significant FFR result of 0.80 on the long OM1 lesion PLAN OF CARE:  Standard post radial catheterization PCI care.  Dual Antiplatelet therapy for minimum of 1 yr  Aggressive medical Rx & lifestyle modification for residual CAD  Anticipate d/c in AM. Has appointment scheduled for 5/13 with me. [name], M.D., M.S.  [facility]  [address]  [place], [place] [address]  [phone]  06/29/2012  Coronary angiography 06/28/12 Access: Right Radial Artery; 5 Fr Glide Sheath -- Seldinger technique using Angiocath Micropuncture Kit  Radial Cocktail, IV Heparin administered Diagnostic: 5 Fr TIG 4.0, Angled Pigtail advanced over Versicore wire - exchanged over long exchange safety J-wire.  Left & Right Coronary Artery Angiography: TIG 4.0  LV Hemodynamics (LV Gram): Angled Pigtail catheter TR Band: 100 Hours, 12 mL air  MEDICATIONS:  Anesthesia: Local Lidocaine 2 ml Sedation: 2 mg IV Versed, 25 mcg IV fentanyl ;  Premedication: 5 mg PO Valium Omnipaque Contrast: 90 ml  Anticoagulation: IV Heparin 3500 Units  Hemodynamics:  Central Aortic / Mean Pressures: 114/69 mmHg; 88 mmHg  Left Ventricular Pressures / EDP: 115/8 mmHg; 12 mmHg Left Ventriculography:  EF: 60-65 %  Wall Motion:  normal Coronary Anatomy:  Left Main: Large caliber, trifurcates into Ramus, Circumflex and LAD LAD: Moderate to large caliber vessel until it bifurcated gaits into the distal LAD and major diagonal branch. There is a small area of irregularity proximal to the diagonal, but distal to the diagonal is a focal 70% lesion followed by napkin ringing 90-95% focal lesion before the vessel tapers down as a small caliber vessel around the apex.  D1: Moderate caliber vessel at least as big as a follow on LAD, mild luminal irregularities. Left Circumflex: Moderate caliber vessel which gives off a small AV groove circumflex with small posterior lateral branches and a smaller moderate caliber lateral OM branch.  OM1: Moderate caliber vessel results in the apex. There is a diffuse 70% irregular region in the proximal vessel. The downstream vessel actually good target with no significant lesions. Ramus intermedius: Small to Moderate caliber vessel with proximal 80% stenosis. This is not in significant but it is relatively small vessel that is not a very good PCI target.  RCA: Large caliber, dominant vessel. There  is diffuse disease starting in a very proximal segment that becomes progressively worse from 40-60% followed by a focal 95-99% near subtotal occlusion. This is followed by a short somewhat ectatic segment in a focal 60% irregular lesion prior to RV marginal branch. The vessel then continues on is relatively normal vessel with a widely patent stents in the distal portion prior to the bifurcation into the RPDA and Right Posterior AV Groove Branch (RPAV)  RPDA: Moderate caliber vessel with diffuse mild luminal irregularity is. Reaches down to the apex.  RPL Sysytem:The RPAV moderate caliber vessel with diffuse luminal irregularities of gives off one major RPL and several smaller RPL's PATIENT DISPOSITION:  The patient was transferred to the PACU holding area in a hemodynamicaly stable, chest pain free condition.   The patient tolerated the procedure well, and there were no complications. EBL: < 10 ml  The patient was stable before, during, and after the procedure. POST-OPERATIVE DIAGNOSIS:  Multivessel CAD with Severe 90-95% mid RCA and mid LAD lesions with ~70-80% Ramus & OM1 lesions.  High Risk Cardiolite with significant Inferior Ischemia  Patent distal RCA Cypher DES  Preserved EF with normal LVEDP PLAN OF CARE:  CT Surgical consult to decide between CABG for 4 Vessel CAD vs. PCI of RCA & LAD +/- Ramus & Circ-OM.  Keep as Observation overnight, but would need revascularization prior to discharge.  As he is not having active symptoms, will not anticoagulate with Heparin.  Restart home medications. Greater than 45 minutes spent with the patient. Additional 20-25 minutes spent discussing the procedure and results with the patient and family post procedure.  [name], M.D., M.S.  [facility]  [address]  [place], [place] [address]  [phone]  06/28/2012 CBC    Component Value Date/Time   WBC 9.4 06/30/2012 0514   RBC 4.53 06/30/2012 0514   HGB 14.1 06/30/2012 0514   HCT 42.0 06/30/2012 0514   PLT 132* 06/30/2012 0514   MCV 92.7 06/30/2012 0514   MCH 31.1 06/30/2012 0514   MCHC 33.6 06/30/2012 0514   RDW 13.2 06/30/2012 0514    BMET    Component Value Date/Time   NA 141 06/30/2012 0514   K 3.7 06/30/2012 0514   CL 107 06/30/2012 0514   CO2 24 06/30/2012 0514   GLUCOSE 97 06/30/2012 0514   BUN 13 06/30/2012 0514   CREATININE 0.67 06/30/2012 0514   CALCIUM 9.3 06/30/2012 0514   GFRNONAA >90 06/30/2012 0514   GFRAA >90 06/30/2012 0514    Lipid Panel     Component Value Date/Time   CHOL 184 01/14/2010 2152   TRIG 153* 01/14/2010 2152   HDL 50 01/14/2010 2152   CHOLHDL 3.7 Ratio 01/14/2010 2152   VLDL 31 01/14/2010 2152   LDLCALC 103* 01/14/2010 2152   Treatments: See above  Discharge Exam: Blood pressure 146/76, pulse 62, temperature 98.6 F (37 C),  temperature source Oral, resp. rate 18, height 5\' 5"  (1.651 m), weight 150 lb 5.7 oz (68.2 kg), SpO2 97.00%.   Disposition:   Discharge Orders   Future Appointments Provider Department Dept Phone   07/04/2012 8:15 AM Charles Lex, MD [facility] [place] [phone]   Future Orders Complete By Expires     Amb Referral to Cardiac Rehabilitation  As directed     Diet - low sodium heart healthy  As directed     Discharge instructions  As directed     Comments:  No lifting with you right hand for three days.  Limit driving for the first week.    Increase activity slowly  As directed         Medication List    STOP taking these medications       aspirin 325 MG tablet     diazepam 5 MG tablet  Commonly known as:  VALIUM      TAKE these medications       ACIDOPHILUS PO  Take 1 capsule by mouth daily.     ALTACE 10 MG capsule  Generic drug:  ramipril  Take 10 mg by mouth daily.     aspirin 81 MG chewable tablet  Chew 1 tablet (81 mg total) by mouth daily.     atorvastatin 20 MG tablet  Commonly known as:  LIPITOR  Take 1 tablet (20 mg total) by mouth daily.     metoprolol succinate 25 MG 24 hr tablet  Commonly known as:  TOPROL-XL  Take 25 mg by mouth daily.     prasugrel 10 MG Tabs  Commonly known as:  EFFIENT  Take 1 tablet (10 mg total) by mouth daily.         SignedWilburt [name] 06/30/2012, 10:59 AM

## 2012-06-30 NOTE — Discharge Summary (Signed)
Looks great today post PCI.  No longer having arm pain or mid back discomfort -- actually noticed being symptom free during cath.  I think he was quite ischemic from the RCA -- felt better after that.  Plan is d/c today -- [facility] to start as early as next week -- has appt to see me next week scheduled. DAPT x at least 1 yr.  Marykay Lex, M.D., M.S. [facility] [address] [place], [place]  [address]  [phone] Pager # [phone] 06/30/2012 11:58 AM

## 2012-06-30 NOTE — Progress Notes (Signed)
I have seen and evaluated the patient this AM along with Wilburt Finlay, PA. I agree with his findings, examination as well as impression recommendations.  Doing well s/p 2 V PCI -- on good cardiac meds including DAPT.  May need to adjust BP coverage as OP.  Ready for discharge.  He has an appointment with me on 5/13.    Marykay Lex, M.D., M.S. [facility] [address] [place], [place]  [address]  [phone] Pager # [phone] 06/30/2012 9:53 AM

## 2012-06-30 NOTE — Progress Notes (Signed)
[facility]  Subjective: Feeling better  Objective: Vital signs in last 24 hours: Temp:  [97.7 F (36.5 C)-98.8 F (37.1 C)] 98.6 F (37 C) (05/09 0840) Pulse Rate:  [57-82] 62 (05/09 0840) Resp:  [18] 18 (05/09 0840) BP: (139-174)/(63-80) 146/76 mmHg (05/09 0840) SpO2:  [97 %-99 %] 97 % (05/09 0840) Weight:  [150 lb 5.7 oz (68.2 kg)] 150 lb 5.7 oz (68.2 kg) (05/09 0500) Last BM Date: 06/28/12  Intake/Output from previous day: 05/08 0701 - 05/09 0700 In: 2139.5 [I.V.:2139.5] Out: 900 [Urine:900] Intake/Output this shift:    Medications Current Facility-Administered Medications  Medication Dose Route Frequency Provider Last Rate Last Dose  . 0.9 %  sodium chloride infusion  250 mL Intravenous PRN Marykay Lex, MD      . acetaminophen (TYLENOL) tablet 650 mg  650 mg Oral Q4H PRN Marykay Lex, MD   650 mg at 06/30/12 0332  . aspirin chewable tablet 81 mg  81 mg Oral Daily Marykay Lex, MD      . atorvastatin (LIPITOR) tablet 20 mg  20 mg Oral q1800 Marykay Lex, MD   20 mg at 06/29/12 1709  . metoprolol succinate (TOPROL-XL) 24 hr tablet 25 mg  25 mg Oral Daily Marykay Lex, MD   25 mg at 06/29/12 0854  . morphine 2 MG/ML injection 1 mg  1 mg Intravenous Q1H PRN Marykay Lex, MD      . ondansetron [facility]) injection 4 mg  4 mg Intravenous Q6H PRN Marykay Lex, MD      . prasugrel (EFFIENT) tablet 10 mg  10 mg Oral Daily Marykay Lex, MD   10 mg at 06/29/12 0854  . ramipril (ALTACE) capsule 10 mg  10 mg Oral Daily Marykay Lex, MD   10 mg at 06/30/12 1610  . sodium chloride 0.9 % injection 3 mL  3 mL Intravenous Q12H Marykay Lex, MD      . sodium chloride 0.9 % injection 3 mL  3 mL Intravenous PRN Marykay Lex, MD        PE: General appearance: alert, cooperative and no distress Lungs: clear to auscultation bilaterally Heart: regular rate and rhythm, S1, S2 normal, no murmur, click, rub or gallop Extremities:  No LEE Pulses: 2+ and symmetric Skin: No errythema, ecchymosis or hematoma at right wrist cath site. Neurologic: Grossly normal  Lab Results:   Recent Labs  06/28/12 1502 06/29/12 0510 06/30/12 0514  WBC 6.0 8.4 9.4  HGB 14.2 14.6 14.1  HCT 40.5 42.3 42.0  PLT 147* 138* 132*   BMET  Recent Labs  06/28/12 1502 06/29/12 0510 06/30/12 0514  NA  --  142 141  K  --  4.0 3.7  CL  --  108 107  CO2  --  21 24  GLUCOSE  --  106* 97  BUN  --  15 13  CREATININE 0.81 0.74 0.67  CALCIUM  --  9.0 9.3   PT/INR  Recent Labs  06/29/12 0510  LABPROT 13.9  INR 1.08   Coronary angiography and PCI Hemodynamics:  Central Aortic / Mean Pressures: 127/71 mmHg; 96 mmHg Coronary Anatomy:  Left Main: Large caliber, trifurcates into Ramus, Circumflex and LAD LAD: Moderate to large caliber vessel until it bifurcated gaits into the distal LAD and major diagonal branch. There is a small area of irregularity proximal to the diagonal, but distal to the diagonal is a focal 70%  lesion followed by napkin ringing 90-95% focal lesion before the vessel tapers down as a small caliber vessel around the apex.  D1: Moderate caliber vessel at least as big as a follow on LAD, mild luminal irregularities. Left Circumflex: Moderate caliber vessel which gives off a small AV groove circumflex with small posterior lateral branches and a smaller moderate caliber lateral OM branch.  OM1: Moderate caliber vessel results in the apex. There is a diffuse 70% irregular region in the proximal vessel. The downstream vessel actually good target with no significant lesions. Ramus intermedius: Small to Moderate caliber vessel with proximal 80% stenosis. This is not in significant but it is relatively small vessel that is not a very good PCI target.  RCA: Large caliber, dominant vessel. Diffuse disease starting in a proximal segment that becomes progressively worse from 40-60% followed by a focal 95-99% near subtotal occlusion.  This is followed by a short somewhat ectatic segment in a focal 60% irregular lesion prior to RV marginal branch. The vessel then continues on is relatively normal vessel with a widely patent stents in the distal portion prior to the bifurcation into the RPDA and Right Posterior AV Groove Branch (RPAV).  RPDA: Moderate caliber vessel with diffuse mild luminal irregularities (~40% proximal is the most notable). The branch reaches down to the apex.  RPL Sysytem:The RPAV moderate caliber vessel with diffuse luminal irregularities of gives off one major RPL and several smaller RPL's Percutaneous Coronary Intervention:  Lesion #1: Proximal to Mid RCA  Guide: 6 Fr JR4 Guidewire: BMW  Predilation Balloon: Trek 2.25 mm x 12 mm;  10 Atm x 30 Sec, 12 Atm x 30 Sec Stent #1 (Proximal): Promus Premier DES 3.0 mm x 28 mm;  14 Atm x 30 Sec,  Post-dilation with stent balloon --> 18 Atm x 45 Sec Post-dilation Balloon #1: [place] Quantum Apex 3.5 mm x 20 mm;  18 Atm x 45 Sec - Final Diameter Proximal ~3.88 mm Post-dilation Balloon #2: [place] Quantum Apex 4.0 mm x 20 mm;  10 Atm x 45 Sec (proximal edge)  16 Atm x 45 Sec (distal edge) - Final Diameter ~4.12 mm Post deployment & dilation angiography revealed "dye hang-up" in the ectatic segment behind the distal stent edge. Additionally, the ~50-60% lesion more distally appeared to be closer to ~70% & potentially significant. For these 2 reasons, the decision was made to cover the distal lesion & the intervening ectatic segment with a 2nd overlapping stent.  Stent #2: Promus Premier 3.5 mm x 18 mm;  12Atm x 30 Sec; post-dilation with stent balloon --> 18 Atm x 45 Sec --> 3.6 mm Post-dilation Balloon:  Quantum Apex 4.0 mm x 20 mm;  18 Atm x 45 Sec  Final Diameter -- 3.68 mm distal; in stent overlap / ectatic segment ~4.3 mm Post deployment angiography in multiple views, with and without guidewire in place revealed excellent stent deployment and lesion coverage. There was  no evidence of dissection or perforation.  Lesion #2: Mid LAD tandem 80% & 95% lesions  Guide: 6 Fr XB LAD Guidewire: BMW  Predilation Balloon: Trek 2.25 mm x 12 mm;  6 Atm x 30 Sec, 8 Atm x 30 Sec -- obvious "dog-boning" effect noted as 95% lesion reduced to <10% Stent: Promus Premier DES 2.25 mm x 28 mm;  12 Atm x 35 Sec Post-dilation Balloon with stent balloon: 16 mm x 45 mm;  Final Diameter --> 2.45 mm Post deployment angiography in multiple views, with and without guidewire  in place revealed excellent stent deployment and lesion coverage. There was no evidence of dissection or perforation.  Lesion #3: OM1 proximal to Mid ~70%  [place] Primewire FFR wire advanced beyond the lesion into a normal segment of vessel.  Baseline ration: 0.94 --> after 2 min Adenosine infusion --> 0.80 (high end of [place] for physiological significance --> Plan Medical Management.  PATIENT DISPOSITION:  The patient was transferred to the PACU holding area in a hemodynamicaly stable, chest pain free condition.  The patient tolerated the procedure well, and there were no complications. EBL: < 20 ml  The patient was stable before, during, and after the procedure. POST-OPERATIVE DIAGNOSIS:  Successful 2 vessel PCI of Proximal to Mid RCA and Mid LAD with Promus Premier DES stents  Borderline significant FFR result of 0.80 on the long OM1 lesion PLAN OF CARE:  Standard post radial catheterization PCI care.  Dual Antiplatelet therapy for minimum of 1 yr  Aggressive medical Rx & lifestyle modification for residual CAD  Anticipate d/c in AM. Has appointment scheduled for 5/13 with me. Marykay Lex, M.D., M.S.  [facility]  [address]  [place], [place] [address]  [phone]  06/29/2012   Assessment/Plan  Principal Problem:   Abnormal nuclear stress test - HIGH RISK; large area of  Inferior-inferoseptal ischemia Active Problems:   HYPERCHOLESTEROLEMIA    HYPERTENSION   CAD (coronary artery disease), native coronary artery, now with severe 90-95% mid RCA and mid LAD lesions 70-80% for PCI   Presence of drug coated stent in right coronary artery   Angina, class II - III  Plan:  SP LHC and PCI to the Proximal to mid RCA and mid LAD with Promus DES. Preserved EF.  ASA, lipitor, Effient, ramipril 10mg , Toprol.  HR stable.  BP could use better control.  Consider amlodipine or hydralazine.  May be able in increase Toprol however, he does get bradycardic, maybe more so at night.   Ambulate this morning and likely DC home today.    LOS: 2 days    [name] 06/30/2012 8:43 AM

## 2012-06-30 NOTE — Progress Notes (Addendum)
CARDIAC REHAB PHASE I   PRE:  Rate/Rhythm: 86 SR  BP:  Supine: 129/65  Sitting:   Standing:    SaO2:   MODE:  Ambulation: 1000 ft   POST:  Rate/Rhythm: 88 SR  BP:  Supine:   Sitting: 154/70  Standing:    SaO2:  Pt tolerated ambulation well without c/o of cp or SOB. VS stable. Completed stent education with pt and wife.Pt voices understanding. Pt agrees to Outpt. CRP in [place], will send referral. [id number] Melina Copa RN 06/30/2012 10:21 AM

## 2012-07-04 ENCOUNTER — Ambulatory Visit (INDEPENDENT_AMBULATORY_CARE_PROVIDER_SITE_OTHER): Payer: BC Managed Care – PPO | Admitting: Cardiology

## 2012-07-04 ENCOUNTER — Encounter: Payer: Self-pay | Admitting: Cardiology

## 2012-07-04 VITALS — BP 120/70 | HR 71 | Ht 65.0 in | Wt 146.1 lb

## 2012-07-04 DIAGNOSIS — I10 Essential (primary) hypertension: Secondary | ICD-10-CM

## 2012-07-04 DIAGNOSIS — E7800 Pure hypercholesterolemia, unspecified: Secondary | ICD-10-CM

## 2012-07-04 DIAGNOSIS — Z9861 Coronary angioplasty status: Secondary | ICD-10-CM

## 2012-07-04 DIAGNOSIS — I2510 Atherosclerotic heart disease of native coronary artery without angina pectoris: Secondary | ICD-10-CM

## 2012-07-04 DIAGNOSIS — Z955 Presence of coronary angioplasty implant and graft: Secondary | ICD-10-CM

## 2012-07-04 NOTE — Assessment & Plan Note (Signed)
Changed statin to Atorvastatin -- recheck labs at next visit.

## 2012-07-04 NOTE — Patient Instructions (Addendum)
OK to return to work today with no restrictions.  Ok to start Cardiac Rehab.  Will see in follow up in ~4-6 months.

## 2012-07-04 NOTE — Assessment & Plan Note (Addendum)
Doing well post PCI of RCA & LAD.  No further angina. Wrist site Clear. Dual Antiplatelet x 1 yr minimum -- change to ASA/Plavix after 1 yr Cardiac Rehab

## 2012-07-04 NOTE — Progress Notes (Signed)
Patient ID: BIRNEY BELSHE, male   DOB: 05-Jul-1955, 57 y.o.   MRN: [id number] [facility]   Clinic Note: HPI: [name] is a  57 year old gentleman past history of coronary disease, hypertension, hyperlipidemia who MC now after his and multivessel PCI last week. He had a high-risk nuclear stress test suggesting inferior ischemia done in to evaluate bilateral arm pain and back pain with exertion. His cath revealed severe RCA and LAD lesions as well as moderate to severe circumflex and ramus intermedius lesions. The patient decided to forego CABG and proceed with multivessel PCI. I performed a PCI of the LAD and the RCA, along with a FFR (fractional flow reserve) measurement on the circumflex flex OM vessel that had a 60-70% lesion. This ratio was 80% which is just at the threshold for being physiologic not significant. We'll continue to treat that medically. Next  He presents today doing well he is back to walking sleep in the activity. He denies any arm pain chest pains was prepped exertion no back pain. No PND, orthopnea, or edema. No TIA, amaurosis fugax, syncope or near-syncope. No melena, hematochezia or, or hematuria. No claudication symptoms.  Allergies  Allergen Reactions  . Simvastatin Other (See Comments)    Unspecified.    Current Outpatient Prescriptions  Medication Sig Dispense Refill  . aspirin 81 MG chewable tablet Chew 1 tablet (81 mg total) by mouth daily.      [name] atorvastatin (LIPITOR) 20 MG tablet Take 1 tablet (20 mg total) by mouth daily.  90 tablet  3  . Lactobacillus (ACIDOPHILUS PO) Take 1 capsule by mouth daily.      . metoprolol (TOPROL-XL) 25 MG 24 hr tablet Take 25 mg by mouth daily.        . prasugrel (EFFIENT) 10 MG TABS Take 1 tablet (10 mg total) by mouth daily.  30 tablet  10  . ramipril (ALTACE) 10 MG capsule Take 10 mg by mouth daily.         No current facility-administered medications for this visit.    Past Medical  History  Diagnosis Date  . CAD (coronary artery disease), native coronary artery Sept 2007    Inf MI - distal RCA 90% - PCI; 60% rPDA; LCA minimal disease; Normal EF without WMA  . Presence of drug coated stent in right coronary artery     Cypher DES 3.0 mm x 13, 3.0 mm x 18 mm overlapping;  . HTN (hypertension)   . Dyslipidemia, goal LDL below 70   . Angina decubitus 06/29/2012  . Presence of drug coated stent in right coronary artery 06/30/12    2 overlapping Promus Premier DES 3.0 mm x 24 mm -- 3.5 mm x 18 mm (dilated prox to distal to: 3.88 -- 4.1, 4.25 - 3.68)  . Presence of drug coated stent in LAD coronary artery 06/30/12    Promus Premier DES 2.25 mm x 28 mm - dilated to 2.45 mm    Past Surgical History  Procedure Laterality Date  . Cataract extraction w/ intraocular lens  implant, bilateral Bilateral 1991-  1992  . Coronary angioplasty with stent placement  10/2005    "2";  Distal RCA: Cypher DES 3.0 mm x 13mm, 3.0 mm x 18 mm overlapping; (06/28/2012)  . Cardiac catheterization  06/28/2012  . Coronary angioplasty with stent placement  06/30/12    2 DES to proximal-mid RCA, 1 DES to LAD (see med history for details)  . Coronary  angioplasty with stent placement  06/29/2012    2X STENTS    ROS: Review of Systems - Negative except Mild bruising at the cath site. PHYSICAL EXAM BP 120/70  Pulse 71  Ht 5\' 5"  (1.651 m)  Wt 146 lb 1.6 oz (66.271 kg)  BMI 24.31 kg/m2 General appearance: alert, cooperative, appears stated age, no distress and Pleasant mood and affect Neck: no adenopathy, no carotid bruit, no JVD, supple, symmetrical, trachea midline and thyroid not enlarged, symmetric, no tenderness/mass/nodules Lungs: clear to auscultation bilaterally, normal percussion bilaterally and Nonlabored Heart: regular rate and rhythm, S1, S2 normal, no murmur, click, rub or gallop Abdomen: soft, non-tender; bowel sounds normal; no masses,  no organomegaly Extremities: extremities normal, atraumatic,  no cyanosis or edema Pulses: 2+ and symmetric Skin: Skin color, texture, turgor normal. No rashes or lesions Neurologic: Alert and oriented X 3, normal strength and tone. Normal symmetric reflexes. Normal coordination and gait Right wrist has a small bruise at the incision site. Reverse Allen's test is positive. No tenderness or pain.  YNW:GNFA:21 , Rhythm: normal sinus;  Conduction Delay none; AV Block: none; non-specific ST-T changes; No interval change  ASSESSMENT AND PLAN: Stable status post PCI to LAD and RCA. The plan is to continue medical therapy for the Obtuse Marginal and Ramus Intermedius lesions. Continue dual antiplatelet therapy for minimal in year with aspirin and Prasugrel. Would then change to Plavix. Continue Lipitor.  Will check statins after next visit Blood pressure well-controlled on ACE inhibitor and beta blocker.  Patient Active Problem List   Diagnosis Date Noted  . Angina, class II - III 06/29/2012    Priority: High  . Abnormal nuclear stress test - HIGH RISK; large area of  Inferior-inferoseptal ischemia 06/23/2012    Priority: High  . Presence of drug coated stent in right coronary artery     Priority: High  . CAD (coronary artery disease), native coronary artery, now with severe 90-95% mid RCA and mid LAD lesions 70-80% for PCI 10/23/2005    Priority: High  . HYPERTENSION 01/11/2008    Priority: Medium  . HYPERCHOLESTEROLEMIA 04/21/2006    Priority: Medium   Plan for problem list.  ROV ~6 months. Cardiac Rehab. Return to work.  No orders of the defined types were placed in this encounter.    Marykay Lex, M.D., M.S. [facility] [address] [place], [place]  [address]  [phone] Pager # [phone] 07/04/2012 8:39 AM

## 2012-07-04 NOTE — Assessment & Plan Note (Signed)
New DES Stents to LAD & RCA. On DAPT.  Stable.

## 2012-07-04 NOTE — Assessment & Plan Note (Signed)
BP looks great today on BB & ACE-I.  No change

## 2012-07-20 ENCOUNTER — Encounter (HOSPITAL_COMMUNITY)
Admission: RE | Admit: 2012-07-20 | Discharge: 2012-07-20 | Disposition: A | Discharge status: Home / Self Care | Payer: BC Managed Care – PPO | Source: Ambulatory Visit | Attending: Cardiology | Admitting: Cardiology

## 2012-07-20 NOTE — Progress Notes (Signed)
Cardiac Rehab Medication Review by a Pharmacist  Does the patient  feel that his/her medications are working for him/her?  yes  Has the patient been experiencing any side effects to the medications prescribed?  no  Does the patient measure his/her own blood pressure or blood glucose at home?  yes  - bp 2-3 x/day  Does the patient have any problems obtaining medications due to transportation or finances?   no  Understanding of regimen: excellent Understanding of indications: excellent Potential of compliance: excellent    Pharmacist comments: No issues identified. Pt very knowledgeable about meds.    Fritschle, Armel Rabbani 07/20/2012 8:19 AM

## 2012-07-24 ENCOUNTER — Encounter (HOSPITAL_COMMUNITY)
Admission: RE | Admit: 2012-07-24 | Discharge: 2012-07-24 | Disposition: A | Discharge status: Home / Self Care | Payer: BC Managed Care – PPO | Source: Ambulatory Visit | Attending: Cardiology | Admitting: Cardiology

## 2012-07-24 DIAGNOSIS — E7800 Pure hypercholesterolemia, unspecified: Secondary | ICD-10-CM | POA: Insufficient documentation

## 2012-07-24 DIAGNOSIS — I200 Unstable angina: Secondary | ICD-10-CM | POA: Insufficient documentation

## 2012-07-24 DIAGNOSIS — I10 Essential (primary) hypertension: Secondary | ICD-10-CM | POA: Insufficient documentation

## 2012-07-24 DIAGNOSIS — Z8249 Family history of ischemic heart disease and other diseases of the circulatory system: Secondary | ICD-10-CM | POA: Insufficient documentation

## 2012-07-24 DIAGNOSIS — Z9861 Coronary angioplasty status: Secondary | ICD-10-CM | POA: Insufficient documentation

## 2012-07-24 DIAGNOSIS — R9439 Abnormal result of other cardiovascular function study: Secondary | ICD-10-CM | POA: Insufficient documentation

## 2012-07-24 DIAGNOSIS — Z5189 Encounter for other specified aftercare: Secondary | ICD-10-CM | POA: Insufficient documentation

## 2012-07-24 DIAGNOSIS — I2510 Atherosclerotic heart disease of native coronary artery without angina pectoris: Secondary | ICD-10-CM | POA: Insufficient documentation

## 2012-07-24 NOTE — Progress Notes (Signed)
Pt in today for his first day of exercise at the 6:45 class time.  Pt tolerated light exercise with no difficulty.  Monitor showed Sr with no noted ectopy.  Pt with sinus brady during cool down with hr noted at 51.  Pt asymptomatic.  Pt did remarked that he takes his toprol Xl at bedtime but forgot last night and took it during the middle of the night.  Will monitor hr on Wednesday.  If bradycardia persist at cool down will advise dr. Herbie Baltimore. PHQ2 0.

## 2012-07-26 ENCOUNTER — Encounter (HOSPITAL_COMMUNITY)
Admission: RE | Admit: 2012-07-26 | Discharge: 2012-07-26 | Disposition: A | Discharge status: Home / Self Care | Payer: BC Managed Care – PPO | Source: Ambulatory Visit | Attending: Cardiology | Admitting: Cardiology

## 2012-07-27 ENCOUNTER — Other Ambulatory Visit (HOSPITAL_COMMUNITY): Payer: Self-pay | Admitting: Physician Assistant

## 2012-07-28 ENCOUNTER — Encounter (HOSPITAL_COMMUNITY)
Admission: RE | Admit: 2012-07-28 | Discharge: 2012-07-28 | Disposition: A | Discharge status: Home / Self Care | Payer: BC Managed Care – PPO | Source: Ambulatory Visit | Attending: Cardiology | Admitting: Cardiology

## 2012-07-28 ENCOUNTER — Telehealth: Payer: Self-pay | Admitting: Cardiology

## 2012-07-28 ENCOUNTER — Encounter (HOSPITAL_COMMUNITY): Payer: BC Managed Care – PPO

## 2012-07-28 NOTE — Telephone Encounter (Signed)
Spoke to the pharmacist regarding the patient's Effient refills. The pharmacist stated that the electronic Rx did not have any refills. I informed her that the Rx I saw in Epic stated that there were supposed to be 10 refills. The pharmacist was able to accept my verbal order, which gave the patient 9 remaining refills.

## 2012-07-28 NOTE — Telephone Encounter (Signed)
[name] is calling because the pharmacy says that he has no refills on his Effient and that it needs authorization .[name]  [address] .[facility] KitchenWill run out of his medication by Sunday.  Thanks

## 2012-07-31 ENCOUNTER — Encounter (HOSPITAL_COMMUNITY)
Admission: RE | Admit: 2012-07-31 | Discharge: 2012-07-31 | Disposition: A | Discharge status: Home / Self Care | Payer: BC Managed Care – PPO | Source: Ambulatory Visit | Attending: Cardiology | Admitting: Cardiology

## 2012-08-02 ENCOUNTER — Encounter (HOSPITAL_COMMUNITY)
Admission: RE | Admit: 2012-08-02 | Discharge: 2012-08-02 | Disposition: A | Discharge status: Home / Self Care | Payer: BC Managed Care – PPO | Source: Ambulatory Visit | Attending: Cardiology | Admitting: Cardiology

## 2012-08-02 NOTE — Progress Notes (Signed)
Reviewed home exercise guidelines with patient including endpoints, temperature precautions, target heart rate and rate of perceived exertion. Pt is walking and biking as his mode of home exercise and has access to a fitness center. Pt voices understanding of instructions given.  Cristy Hilts, MS, ACSM CES

## 2012-08-04 ENCOUNTER — Encounter (HOSPITAL_COMMUNITY)
Admission: RE | Admit: 2012-08-04 | Discharge: 2012-08-04 | Disposition: A | Discharge status: Home / Self Care | Payer: BC Managed Care – PPO | Source: Ambulatory Visit | Attending: Cardiology | Admitting: Cardiology

## 2012-08-07 ENCOUNTER — Encounter (HOSPITAL_COMMUNITY)
Admission: RE | Admit: 2012-08-07 | Discharge: 2012-08-07 | Disposition: A | Discharge status: Home / Self Care | Payer: BC Managed Care – PPO | Source: Ambulatory Visit | Attending: Cardiology | Admitting: Cardiology

## 2012-08-09 ENCOUNTER — Encounter (HOSPITAL_COMMUNITY)
Admission: RE | Admit: 2012-08-09 | Discharge: 2012-08-09 | Disposition: A | Discharge status: Home / Self Care | Payer: BC Managed Care – PPO | Source: Ambulatory Visit | Attending: Cardiology | Admitting: Cardiology

## 2012-08-09 NOTE — Progress Notes (Signed)
[name] 57 y.o. male Nutrition Note Spoke with pt.  Nutrition Plan and Nutrition Survey goals reviewed with pt. Pt is following Step 2 of the Therapeutic Lifestyle Changes diet. Pt wants to lose wt despite being at a normal BMI. Pt states he lost 10 lb from March-May 2014 and is at a wt loss plateau. Pt reports his UBW was 150 lb before trying to lose wt.  Pt has been trying to lose wt by exercising in the morning and the evening. Wt loss tips reviewed. Pt encouraged to focus on increasing muscle mass/decreasing %body fat. Pt expressed understanding of the information reviewed. Pt aware of nutrition education classes offered and is unable to attend nutrition classes due to work schedule.  Nutrition Diagnosis   Food-and nutrition-related knowledge deficit related to lack of exposure to information as related to diagnosis of: ? CVD   Nutrition RX/ Estimated Daily Nutrition Needs for: wt loss  1300-1800 Kcal, 35-50 gm fat, 8-12 gm sat fat, 1.2-1.8 gm trans-fat, <1500 mg sodium   Nutrition Intervention   Pt's individual nutrition plan including cholesterol goals reviewed with pt.   Benefits of adopting Therapeutic Lifestyle Changes discussed when Medficts reviewed.   Pt to attend the Portion Distortion class   Pt given handouts for: ? Nutrition I class ? Nutrition II class ? 5 day, 1500 kcal menu ideas   Continue client-centered nutrition education by RD, as part of interdisciplinary care.  Goal(s)   Pt to identify food quantities necessary to achieve: ? wt loss to a goal wt of 135 lb (61.4 kg) at graduation from cardiac rehab.   Monitor and Evaluate progress toward nutrition goal with team. Nutrition Risk:  Low   Mickle Plumb, M.Ed, RD, LDN, CDE 08/09/2012 9:28 AM

## 2012-08-11 ENCOUNTER — Encounter (HOSPITAL_COMMUNITY): Payer: BC Managed Care – PPO

## 2012-08-14 ENCOUNTER — Encounter (HOSPITAL_COMMUNITY)
Admission: RE | Admit: 2012-08-14 | Discharge: 2012-08-14 | Disposition: A | Discharge status: Home / Self Care | Payer: BC Managed Care – PPO | Source: Ambulatory Visit | Attending: Cardiology | Admitting: Cardiology

## 2012-08-16 ENCOUNTER — Encounter (HOSPITAL_COMMUNITY)
Admission: RE | Admit: 2012-08-16 | Discharge: 2012-08-16 | Disposition: A | Discharge status: Home / Self Care | Payer: BC Managed Care – PPO | Source: Ambulatory Visit | Attending: Cardiology | Admitting: Cardiology

## 2012-08-18 ENCOUNTER — Encounter (HOSPITAL_COMMUNITY)
Admission: RE | Admit: 2012-08-18 | Discharge: 2012-08-18 | Disposition: A | Discharge status: Home / Self Care | Payer: BC Managed Care – PPO | Source: Ambulatory Visit | Attending: Cardiology | Admitting: Cardiology

## 2012-08-21 ENCOUNTER — Encounter (HOSPITAL_COMMUNITY): Payer: BC Managed Care – PPO

## 2012-08-23 ENCOUNTER — Encounter (HOSPITAL_COMMUNITY)
Admission: RE | Admit: 2012-08-23 | Discharge: 2012-08-23 | Disposition: A | Discharge status: Home / Self Care | Payer: BC Managed Care – PPO | Source: Ambulatory Visit | Attending: Cardiology | Admitting: Cardiology

## 2012-08-23 DIAGNOSIS — Z5189 Encounter for other specified aftercare: Secondary | ICD-10-CM | POA: Insufficient documentation

## 2012-08-23 DIAGNOSIS — I10 Essential (primary) hypertension: Secondary | ICD-10-CM | POA: Insufficient documentation

## 2012-08-23 DIAGNOSIS — I200 Unstable angina: Secondary | ICD-10-CM | POA: Insufficient documentation

## 2012-08-23 DIAGNOSIS — I2510 Atherosclerotic heart disease of native coronary artery without angina pectoris: Secondary | ICD-10-CM | POA: Insufficient documentation

## 2012-08-23 DIAGNOSIS — Z9861 Coronary angioplasty status: Secondary | ICD-10-CM | POA: Insufficient documentation

## 2012-08-23 DIAGNOSIS — Z8249 Family history of ischemic heart disease and other diseases of the circulatory system: Secondary | ICD-10-CM | POA: Insufficient documentation

## 2012-08-23 DIAGNOSIS — R9439 Abnormal result of other cardiovascular function study: Secondary | ICD-10-CM | POA: Insufficient documentation

## 2012-08-23 DIAGNOSIS — E7800 Pure hypercholesterolemia, unspecified: Secondary | ICD-10-CM | POA: Insufficient documentation

## 2012-08-25 ENCOUNTER — Encounter (HOSPITAL_COMMUNITY): Payer: BC Managed Care – PPO

## 2012-08-28 ENCOUNTER — Encounter (HOSPITAL_COMMUNITY)
Admission: RE | Admit: 2012-08-28 | Discharge: 2012-08-28 | Disposition: A | Discharge status: Home / Self Care | Payer: BC Managed Care – PPO | Source: Ambulatory Visit | Attending: Cardiology | Admitting: Cardiology

## 2012-08-30 ENCOUNTER — Encounter (HOSPITAL_COMMUNITY)
Admission: RE | Admit: 2012-08-30 | Discharge: 2012-08-30 | Disposition: A | Discharge status: Home / Self Care | Payer: BC Managed Care – PPO | Source: Ambulatory Visit | Attending: Cardiology | Admitting: Cardiology

## 2012-09-01 ENCOUNTER — Encounter (HOSPITAL_COMMUNITY)
Admission: RE | Admit: 2012-09-01 | Discharge: 2012-09-01 | Disposition: A | Discharge status: Home / Self Care | Payer: BC Managed Care – PPO | Source: Ambulatory Visit | Attending: Cardiology | Admitting: Cardiology

## 2012-09-03 ENCOUNTER — Other Ambulatory Visit: Payer: Self-pay | Admitting: Cardiology

## 2012-09-04 ENCOUNTER — Encounter (HOSPITAL_COMMUNITY)
Admission: RE | Admit: 2012-09-04 | Discharge: 2012-09-04 | Disposition: A | Discharge status: Home / Self Care | Payer: BC Managed Care – PPO | Source: Ambulatory Visit | Attending: Cardiology | Admitting: Cardiology

## 2012-09-04 NOTE — Telephone Encounter (Signed)
Rx was sent to pharmacy electronically. 

## 2012-09-06 ENCOUNTER — Encounter (HOSPITAL_COMMUNITY)
Admission: RE | Admit: 2012-09-06 | Discharge: 2012-09-06 | Disposition: A | Discharge status: Home / Self Care | Payer: BC Managed Care – PPO | Source: Ambulatory Visit | Attending: Cardiology | Admitting: Cardiology

## 2012-09-08 ENCOUNTER — Encounter (HOSPITAL_COMMUNITY)
Admission: RE | Admit: 2012-09-08 | Discharge: 2012-09-08 | Disposition: A | Discharge status: Home / Self Care | Payer: BC Managed Care – PPO | Source: Ambulatory Visit | Attending: Cardiology | Admitting: Cardiology

## 2012-09-11 ENCOUNTER — Encounter (HOSPITAL_COMMUNITY)
Admission: RE | Admit: 2012-09-11 | Discharge: 2012-09-11 | Disposition: A | Discharge status: Home / Self Care | Payer: BC Managed Care – PPO | Source: Ambulatory Visit | Attending: Cardiology | Admitting: Cardiology

## 2012-09-13 ENCOUNTER — Encounter (HOSPITAL_COMMUNITY)
Admission: RE | Admit: 2012-09-13 | Discharge: 2012-09-13 | Disposition: A | Discharge status: Home / Self Care | Payer: BC Managed Care – PPO | Source: Ambulatory Visit | Attending: Cardiology | Admitting: Cardiology

## 2012-09-15 ENCOUNTER — Encounter (HOSPITAL_COMMUNITY): Payer: BC Managed Care – PPO

## 2012-09-18 ENCOUNTER — Encounter (HOSPITAL_COMMUNITY)
Admission: RE | Admit: 2012-09-18 | Discharge: 2012-09-18 | Disposition: A | Discharge status: Home / Self Care | Payer: BC Managed Care – PPO | Source: Ambulatory Visit | Attending: Cardiology | Admitting: Cardiology

## 2012-09-20 ENCOUNTER — Encounter (HOSPITAL_COMMUNITY)
Admission: RE | Admit: 2012-09-20 | Discharge: 2012-09-20 | Disposition: A | Discharge status: Home / Self Care | Payer: BC Managed Care – PPO | Source: Ambulatory Visit | Attending: Cardiology | Admitting: Cardiology

## 2012-09-22 ENCOUNTER — Encounter (HOSPITAL_COMMUNITY)
Admission: RE | Admit: 2012-09-22 | Discharge: 2012-09-22 | Disposition: A | Discharge status: Home / Self Care | Payer: BC Managed Care – PPO | Source: Ambulatory Visit | Attending: Cardiology | Admitting: Cardiology

## 2012-09-22 DIAGNOSIS — Z5189 Encounter for other specified aftercare: Secondary | ICD-10-CM | POA: Insufficient documentation

## 2012-09-22 DIAGNOSIS — R9439 Abnormal result of other cardiovascular function study: Secondary | ICD-10-CM | POA: Insufficient documentation

## 2012-09-22 DIAGNOSIS — I200 Unstable angina: Secondary | ICD-10-CM | POA: Insufficient documentation

## 2012-09-22 DIAGNOSIS — Z8249 Family history of ischemic heart disease and other diseases of the circulatory system: Secondary | ICD-10-CM | POA: Insufficient documentation

## 2012-09-22 DIAGNOSIS — E7800 Pure hypercholesterolemia, unspecified: Secondary | ICD-10-CM | POA: Insufficient documentation

## 2012-09-22 DIAGNOSIS — I10 Essential (primary) hypertension: Secondary | ICD-10-CM | POA: Insufficient documentation

## 2012-09-22 DIAGNOSIS — Z9861 Coronary angioplasty status: Secondary | ICD-10-CM | POA: Insufficient documentation

## 2012-09-22 DIAGNOSIS — I2510 Atherosclerotic heart disease of native coronary artery without angina pectoris: Secondary | ICD-10-CM | POA: Insufficient documentation

## 2012-09-25 ENCOUNTER — Encounter (HOSPITAL_COMMUNITY)
Admission: RE | Admit: 2012-09-25 | Discharge: 2012-09-25 | Disposition: A | Discharge status: Home / Self Care | Payer: BC Managed Care – PPO | Source: Ambulatory Visit | Attending: Cardiology | Admitting: Cardiology

## 2012-09-27 ENCOUNTER — Encounter (HOSPITAL_COMMUNITY)
Admission: RE | Admit: 2012-09-27 | Discharge: 2012-09-27 | Disposition: A | Discharge status: Home / Self Care | Payer: BC Managed Care – PPO | Source: Ambulatory Visit | Attending: Cardiology | Admitting: Cardiology

## 2012-09-29 ENCOUNTER — Encounter (HOSPITAL_COMMUNITY)
Admission: RE | Admit: 2012-09-29 | Discharge: 2012-09-29 | Disposition: A | Discharge status: Home / Self Care | Payer: BC Managed Care – PPO | Source: Ambulatory Visit | Attending: Cardiology | Admitting: Cardiology

## 2012-10-02 ENCOUNTER — Encounter (HOSPITAL_COMMUNITY): Payer: BC Managed Care – PPO

## 2012-10-04 ENCOUNTER — Encounter (HOSPITAL_COMMUNITY)
Admission: RE | Admit: 2012-10-04 | Discharge: 2012-10-04 | Disposition: A | Discharge status: Home / Self Care | Payer: BC Managed Care – PPO | Source: Ambulatory Visit | Attending: Cardiology | Admitting: Cardiology

## 2012-10-06 ENCOUNTER — Encounter (HOSPITAL_COMMUNITY)
Admission: RE | Admit: 2012-10-06 | Discharge: 2012-10-06 | Disposition: A | Discharge status: Home / Self Care | Payer: BC Managed Care – PPO | Source: Ambulatory Visit | Attending: Cardiology | Admitting: Cardiology

## 2012-10-09 ENCOUNTER — Encounter (HOSPITAL_COMMUNITY)
Admission: RE | Admit: 2012-10-09 | Discharge: 2012-10-09 | Disposition: A | Discharge status: Home / Self Care | Payer: BC Managed Care – PPO | Source: Ambulatory Visit | Attending: Cardiology | Admitting: Cardiology

## 2012-10-11 ENCOUNTER — Encounter (HOSPITAL_COMMUNITY)
Admission: RE | Admit: 2012-10-11 | Discharge: 2012-10-11 | Disposition: A | Discharge status: Home / Self Care | Payer: BC Managed Care – PPO | Source: Ambulatory Visit | Attending: Cardiology | Admitting: Cardiology

## 2012-10-13 ENCOUNTER — Encounter (HOSPITAL_COMMUNITY)
Admission: RE | Admit: 2012-10-13 | Discharge: 2012-10-13 | Disposition: A | Discharge status: Home / Self Care | Payer: BC Managed Care – PPO | Source: Ambulatory Visit | Attending: Cardiology | Admitting: Cardiology

## 2012-10-16 ENCOUNTER — Encounter (HOSPITAL_COMMUNITY)
Admission: RE | Admit: 2012-10-16 | Discharge: 2012-10-16 | Disposition: A | Discharge status: Home / Self Care | Payer: BC Managed Care – PPO | Source: Ambulatory Visit | Attending: Cardiology | Admitting: Cardiology

## 2012-10-18 ENCOUNTER — Encounter (HOSPITAL_COMMUNITY)
Admission: RE | Admit: 2012-10-18 | Discharge: 2012-10-18 | Disposition: A | Discharge status: Home / Self Care | Payer: BC Managed Care – PPO | Source: Ambulatory Visit | Attending: Cardiology | Admitting: Cardiology

## 2012-10-20 ENCOUNTER — Encounter (HOSPITAL_COMMUNITY)
Admission: RE | Admit: 2012-10-20 | Discharge: 2012-10-20 | Disposition: A | Discharge status: Home / Self Care | Payer: BC Managed Care – PPO | Source: Ambulatory Visit | Attending: Cardiology | Admitting: Cardiology

## 2012-10-23 ENCOUNTER — Encounter (HOSPITAL_COMMUNITY): Payer: BC Managed Care – PPO

## 2012-10-23 DIAGNOSIS — I10 Essential (primary) hypertension: Secondary | ICD-10-CM | POA: Insufficient documentation

## 2012-10-23 DIAGNOSIS — Z9861 Coronary angioplasty status: Secondary | ICD-10-CM | POA: Insufficient documentation

## 2012-10-23 DIAGNOSIS — I200 Unstable angina: Secondary | ICD-10-CM | POA: Insufficient documentation

## 2012-10-23 DIAGNOSIS — I2510 Atherosclerotic heart disease of native coronary artery without angina pectoris: Secondary | ICD-10-CM | POA: Insufficient documentation

## 2012-10-23 DIAGNOSIS — E7800 Pure hypercholesterolemia, unspecified: Secondary | ICD-10-CM | POA: Insufficient documentation

## 2012-10-23 DIAGNOSIS — R9439 Abnormal result of other cardiovascular function study: Secondary | ICD-10-CM | POA: Insufficient documentation

## 2012-10-23 DIAGNOSIS — Z5189 Encounter for other specified aftercare: Secondary | ICD-10-CM | POA: Insufficient documentation

## 2012-10-23 DIAGNOSIS — Z8249 Family history of ischemic heart disease and other diseases of the circulatory system: Secondary | ICD-10-CM | POA: Insufficient documentation

## 2012-10-25 ENCOUNTER — Encounter (HOSPITAL_COMMUNITY)
Admission: RE | Admit: 2012-10-25 | Discharge: 2012-10-25 | Disposition: A | Discharge status: Home / Self Care | Payer: BC Managed Care – PPO | Source: Ambulatory Visit | Attending: Cardiology | Admitting: Cardiology

## 2012-10-27 ENCOUNTER — Encounter (HOSPITAL_COMMUNITY)
Admission: RE | Admit: 2012-10-27 | Discharge: 2012-10-27 | Disposition: A | Discharge status: Home / Self Care | Payer: BC Managed Care – PPO | Source: Ambulatory Visit | Attending: Cardiology | Admitting: Cardiology

## 2012-10-27 NOTE — Progress Notes (Signed)
Pt graduates today with the completion of 36 exercise sessions in Cardiac Rehab phase II.  Pt plans to continue his exercise at a community recreation center and walking.  Repeat PHQ2 score - 0.  Medication list reconciled.

## 2012-11-09 ENCOUNTER — Encounter: Payer: Self-pay | Admitting: Cardiology

## 2012-11-09 ENCOUNTER — Ambulatory Visit (INDEPENDENT_AMBULATORY_CARE_PROVIDER_SITE_OTHER): Payer: BC Managed Care – PPO | Admitting: Cardiology

## 2012-11-09 VITALS — BP 110/70 | HR 58 | Ht 65.0 in | Wt 139.0 lb

## 2012-11-09 DIAGNOSIS — Z79899 Other long term (current) drug therapy: Secondary | ICD-10-CM

## 2012-11-09 DIAGNOSIS — I10 Essential (primary) hypertension: Secondary | ICD-10-CM

## 2012-11-09 DIAGNOSIS — Z955 Presence of coronary angioplasty implant and graft: Secondary | ICD-10-CM

## 2012-11-09 DIAGNOSIS — E782 Mixed hyperlipidemia: Secondary | ICD-10-CM

## 2012-11-09 DIAGNOSIS — I2510 Atherosclerotic heart disease of native coronary artery without angina pectoris: Secondary | ICD-10-CM

## 2012-11-09 DIAGNOSIS — Z9861 Coronary angioplasty status: Secondary | ICD-10-CM

## 2012-11-10 MED ORDER — RAMIPRIL 10 MG PO CAPS: 10.0000 mg | ORAL_CAPSULE | Freq: Every day | ORAL | Status: DC

## 2012-11-10 MED ORDER — PRASUGREL HCL 10 MG PO TABS: ORAL_TABLET | ORAL | Status: DC

## 2012-11-10 MED ORDER — ATORVASTATIN CALCIUM 20 MG PO TABS: 20.0000 mg | ORAL_TABLET | Freq: Every day | ORAL | Status: DC

## 2012-11-10 MED ORDER — METOPROLOL SUCCINATE ER 25 MG PO TB24: 25.0000 mg | ORAL_TABLET | Freq: Every day | ORAL | Status: DC

## 2012-11-10 NOTE — Patient Instructions (Signed)
Your physician wants you to follow-up in 6 months Dr Herbie Baltimore. You will receive a reminder letter in the mail two months in advance. If you don't receive a letter, please call our office to schedule the follow-up appointment.

## 2012-11-11 ENCOUNTER — Encounter: Payer: Self-pay | Admitting: Cardiology

## 2012-11-11 DIAGNOSIS — Z79899 Other long term (current) drug therapy: Secondary | ICD-10-CM | POA: Insufficient documentation

## 2012-11-11 DIAGNOSIS — E785 Hyperlipidemia, unspecified: Secondary | ICD-10-CM | POA: Insufficient documentation

## 2012-11-11 NOTE — Assessment & Plan Note (Signed)
Bifurcates into atorvastatin after his last labs. The initial plan was to recheck the labs from this visit. I be a little bit longer since he is doing so well his exercise. Also to check the labs before his 6 month followup.

## 2012-11-11 NOTE — Assessment & Plan Note (Signed)
On statin and ACE inhibitor. We'll follow lipid panel and LFTs with CMP as well.

## 2012-11-11 NOTE — Progress Notes (Signed)
PCP: Carney Living, MD  Clinic Note: Chief Complaint  Patient presents with  . Follow-up    ROV 3 months, no complaints    HPI: [name] is a 57 y.o. male with a past history of coronary disease, hypertension, hyperlipidemia who MC now after his and multivessel PCI last week. He had a high-risk nuclear stress test suggesting inferior ischemia done in to evaluate bilateral arm pain and back pain along with shortness of breath with exertion. His cath revealed severe RCA and LAD lesions as well as moderate to severe circumflex and ramus intermedius lesions. The patient decided to forego CABG and proceed with multivessel PCI. I performed a PCI of the LAD and the RCA, along with a FFR (fractional flow reserve) measurement on the circumflex flex OM vessel that had a 60-70% lesion. This ratio was 80% which is just at the threshold for being physiologic not significant. We'll continue to treat that medically. I last saw him on May 13 as a post PCI visit. He was about ready to start, and very excited to start cardiac rehabilitation. He did very well to that described on September 5. He is continuing to stay active and exercising. He is considered actually staying with the cardiac rehabilitation program for maintenance, but has not yet decided to.  He is also monitor his diet.  Interval History: He's done extremely well so the last few months. He denies any symptoms whatsoever of chest tightness or pressure with rest or exertion. No dyspnea at rest or exertion. Denies any melena, hematochezia or hematuria while on dual antiplatelet therapy. He continues to be very active, working at the [employer]. He's also continue the rehabilitation recommended stretches and weightlifting and exercises as well. He states that walking up to 2 times a day.  The remainder of Cardiovascular ROS: negative for - chest pain, dyspnea on exertion, edema, irregular heartbeat, loss of consciousness, murmur,  orthopnea, palpitations, paroxysmal nocturnal dyspnea, rapid heart rate or shortness of breath is as follows: Additional cardiac review of systems: Lightheadedness - no, dizziness - no, syncope/near-syncope - no; TIA/amaurosis fugax - no Melena - no, hematochezia no; hematuria - no; nosebleeds - no; claudication - no  Past Medical History  Diagnosis Date  . Hx of non-ST elevation myocardial infarction (NSTEMI) Sept 2007    Inf MI - distal RCA 90% - PCI; 60% rPDA; LCA minimal disease; Normal EF without WMA  . Presence of drug coated stent in right coronary artery Sept 2007    Cypher DES 3.0 mm x 13, 3.0 mm x 18 mm overlapping;  . CAD S/P percutaneous coronary angioplasty 06/29/12    Unstable Angina (High Risk Inferior Ischemia on Myoview) -- mid LAD 70% & 95% after D1; OM1 70%, RCA: prox 60%, mid 95% with ectasia, distal mid 60%, then distal Cypeher stents going into RPAV - patent;; FFR of OM1 ~0.8 (borderline significant)  . Presence of drug coated stent in right coronary artery 06/30/12    2 overlapping Promus Premier DES 3.0 mm x 24 mm -- 3.5 mm x 18 mm (dilated prox to distal to: 3.88 -- 4.1, 4.25 - 3.68)  . Presence of drug coated stent in LAD coronary artery 06/30/12    Promus Premier DES 2.25 mm x 28 mm - dilated to 2.45 mm  . HTN (hypertension)   . Hyperlipidemia LDL goal <70    Allergies  Allergen Reactions  . Simvastatin Other (See Comments)    Dizzy, spaced out  Current Outpatient Prescriptions  Medication Sig Dispense Refill  . aspirin 81 MG chewable tablet Chew 1 tablet (81 mg total) by mouth daily.      [name] atorvastatin (LIPITOR) 20 MG tablet Take 1 tablet (20 mg total) by mouth daily.  30 tablet  11  . metoprolol succinate (TOPROL-XL) 25 MG 24 hr tablet Take 1 tablet (25 mg total) by mouth daily.  30 tablet  11  . NITROSTAT 0.4 MG SL tablet TAKE UP TO 3 TABS 5 MINUTES APART AS NEEDED FOR CHEST PAIN  25 tablet  10  . prasugrel (EFFIENT) 10 MG TABS tablet TAKE 1 TABLET (10 MG  TOTAL) BY MOUTH DAILY.  30 tablet  11  . ramipril (ALTACE) 10 MG capsule Take 1 capsule (10 mg total) by mouth daily.  30 capsule  11   No current facility-administered medications for this visit.   History   Social History Narrative  . No narrative on file    ROS: A comprehensive Review of Systems - Negative except Mild musculoskeletal aches and pains. Otherwise doing very well and healthy.  PHYSICAL EXAM BP 110/70  Pulse 58  Ht 5\' 5"  (1.651 m)  Wt 139 lb (63.05 kg)  BMI 23.13 kg/m2 General appearance: alert, cooperative, appears stated age, no distress and Healthy-appearing. Well-nourished and well-groomed. He answers questions appropriate. Neck: no adenopathy, no carotid bruit, no JVD and supple, symmetrical, trachea midline Lungs: clear to auscultation bilaterally, normal percussion bilaterally and Good air movement, nonlabored. Heart: regular rate and rhythm, S1, S2 normal, no murmur, click, rub or gallop and normal apical impulse Abdomen: soft, non-tender; bowel sounds normal; no masses,  no organomegaly Extremities: extremities normal, atraumatic, no cyanosis or edema Pulses: 2+ and symmetric Neurologic: Grossly normal  JXB:JYNWGNFAO today: Yes Rate:58 , Rhythm: Sinus Bradycardia, otherwise normal ECG.  Recent Labs: April 2014  TC 196, TG 127, HDL 39 and LDL 117  ASSESSMENT / PLAN: CAD (coronary artery disease), native coronary artery, now with severe 90-95% mid RCA and mid LAD lesions 70-80% for PCI; FFR of OM1 ~70% lesion = 0.8 He clearly had a significant anginal symptoms with significant ischemic showing on his stress test. His coronary anatomy actually ended up worse in the expected.  He is getting his that he made the right decision to opt for percutaneous intervention as opposed to bypass surgery.  He is doing extremely well overall post PCI. Clearly the most significant lesions were treated. He still has the remaining OM lesion, therefore will need to be  aggressive with her continued medical therapy. Simply because of the stents he has now, I would like to continue him on DAPT indefinitely.   Plan: Continue DAPT, beta blocker, ACE inhibitor and statin at current regimen.  Presence of drug coated stent in right coronary artery Based on the extent of stents. Would keep on DAPT indefinitely. Can switch from Effient to Plavix after the first year to 18 months. But would likely not stop one of the 2 indefinitely.  HYPERTENSION Excellent blood pressure control current regimen. Certainly his exercising will also help in this as well.  Continued current dose of Toprol and lisinopril.  HYPERCHOLESTEROLEMIA Bifurcates into atorvastatin after his last labs. The initial plan was to recheck the labs from this visit. I be a little bit longer since he is doing so well his exercise. Also to check the labs before his 6 month followup.  Encounter for long-term (current) use of other medications On statin and ACE inhibitor. We'll follow  lipid panel and LFTs with CMP as well.  Mixed hyperlipidemia Bifurcates into atorvastatin after his last labs. The initial plan was to recheck the labs from this visit. I be a little bit longer since he is doing so well his exercise. Also to check the labs before his 6 month followup.   Orders Placed This Encounter  Procedures  . Lipid panel    Order Specific Question:  Has the patient fasted?    Answer:  Yes  . Comprehensive metabolic panel    Order Specific Question:  Has the patient fasted?    Answer:  Yes  . EKG 12-Lead    Order Specific Question:  Where should this test be performed    Answer:  OTHER   [facility]. Meds ordered this encounter  Medications  . atorvastatin (LIPITOR) 20 MG tablet    Sig: Take 1 tablet (20 mg total) by mouth daily.    Dispense:  30 tablet    Refill:  11  . prasugrel (EFFIENT) 10 MG TABS tablet    Sig: TAKE 1 TABLET (10 MG TOTAL) BY MOUTH DAILY.    Dispense:  30 tablet     Refill:  11  . metoprolol succinate (TOPROL-XL) 25 MG 24 hr tablet    Sig: Take 1 tablet (25 mg total) by mouth daily.    Dispense:  30 tablet    Refill:  11  . ramipril (ALTACE) 10 MG capsule    Sig: Take 1 capsule (10 mg total) by mouth daily.    Dispense:  30 capsule    Refill:  11    Followup: 6 months  DAVID W. Herbie Baltimore, M.D., M.S. [facility] [address] [place], [place]  [address]  [phone] Pager # [phone]

## 2012-11-11 NOTE — Assessment & Plan Note (Signed)
Excellent blood pressure control current regimen. Certainly his exercising will also help in this as well.  Continued current dose of Toprol and lisinopril.

## 2012-11-11 NOTE — Assessment & Plan Note (Addendum)
He clearly had a significant anginal symptoms with significant ischemic showing on his stress test. His coronary anatomy actually ended up worse in the expected.  He is getting his that he made the right decision to opt for percutaneous intervention as opposed to bypass surgery.  He is doing extremely well overall post PCI. Clearly the most significant lesions were treated. He still has the remaining OM lesion, therefore will need to be aggressive with her continued medical therapy. Simply because of the stents he has now, I would like to continue him on DAPT indefinitely.   Plan: Continue DAPT, beta blocker, ACE inhibitor and statin at current regimen.

## 2012-11-11 NOTE — Assessment & Plan Note (Signed)
Based on the extent of stents. Would keep on DAPT indefinitely. Can switch from Effient to Plavix after the first year to 18 months. But would likely not stop one of the 2 indefinitely.

## 2012-11-11 NOTE — Assessment & Plan Note (Signed)
Bifurcates into atorvastatin after his last labs. The initial plan was to recheck the labs from this visit. I be a little bit longer since he is doing so well his exercise. Also to check the labs before his 6 month followup. 

## 2012-12-02 LAB — LIPID PANEL
Cholesterol: 139 mg/dL (ref 0–200)
VLDL: 11 mg/dL (ref 0–40)

## 2012-12-02 LAB — COMPREHENSIVE METABOLIC PANEL
ALT: 21 U/L (ref 0–53)
AST: 24 U/L (ref 0–37)
Albumin: 4 g/dL (ref 3.5–5.2)
BUN: 16 mg/dL (ref 6–23)
Calcium: 9.1 mg/dL (ref 8.4–10.5)
Chloride: 105 mEq/L (ref 96–112)
Potassium: 4.3 mEq/L (ref 3.5–5.3)

## 2012-12-04 ENCOUNTER — Telehealth: Payer: Self-pay | Admitting: *Deleted

## 2012-12-04 NOTE — Telephone Encounter (Signed)
Spoke to wife. Result given . Verbalized understanding Release to [facility] CHART

## 2012-12-04 NOTE — Telephone Encounter (Signed)
Message copied by Tobin Chad on Mon Dec 04, 2012  2:50 PM ------      Message from: Marykay Lex      Created: Sun Dec 03, 2012  1:37 AM       Labs look great!!.  Everything improved.        LDL is just about at goal - down ~30 points from 2 yr ago.            Marykay Lex, MD       ------

## 2012-12-05 ENCOUNTER — Other Ambulatory Visit: Payer: Self-pay | Admitting: Cardiology

## 2012-12-06 NOTE — Telephone Encounter (Signed)
Rx was sent to pharmacy electronically. 

## 2012-12-13 ENCOUNTER — Encounter: Payer: Self-pay | Admitting: Home Health Services

## 2012-12-28 ENCOUNTER — Other Ambulatory Visit: Payer: Self-pay

## 2013-05-17 ENCOUNTER — Ambulatory Visit (INDEPENDENT_AMBULATORY_CARE_PROVIDER_SITE_OTHER): Payer: BC Managed Care – PPO | Admitting: Cardiology

## 2013-05-17 ENCOUNTER — Encounter: Payer: Self-pay | Admitting: Cardiology

## 2013-05-17 VITALS — BP 120/72 | HR 62 | Ht 65.0 in | Wt 142.7 lb

## 2013-05-17 DIAGNOSIS — Z79899 Other long term (current) drug therapy: Secondary | ICD-10-CM

## 2013-05-17 DIAGNOSIS — I10 Essential (primary) hypertension: Secondary | ICD-10-CM

## 2013-05-17 DIAGNOSIS — I209 Angina pectoris, unspecified: Secondary | ICD-10-CM

## 2013-05-17 DIAGNOSIS — I2510 Atherosclerotic heart disease of native coronary artery without angina pectoris: Secondary | ICD-10-CM

## 2013-05-17 DIAGNOSIS — Z9861 Coronary angioplasty status: Secondary | ICD-10-CM

## 2013-05-17 DIAGNOSIS — E7800 Pure hypercholesterolemia, unspecified: Secondary | ICD-10-CM

## 2013-05-17 DIAGNOSIS — Z955 Presence of coronary angioplasty implant and graft: Secondary | ICD-10-CM

## 2013-05-17 NOTE — Assessment & Plan Note (Addendum)
Oct labs looked great - recheck in 1 yr (Oct 2015 - NMR panel).

## 2013-05-17 NOTE — Progress Notes (Signed)
PCP: Carney LivingHAMBLISS,MARSHALL L, MD  Clinic Note: Chief Complaint  Patient presents with  . 6 month visit    no chest pain , no sob ,no edema     HPI: [name] is a 58 y.o. male with a Cardiovascular Problem List below who presents today for 6 month f/u for CAD.  He has a history of PCI to the RCA with 2 Cypher stents in 2007 for acute coronary syndrome. In May of last year, he presented with somewhat atypical Class II Anginal symptoms (of note his ACS symptoms were also atypical) and had a High-Risk Myoview Stress Test demonstrating inferior and inferolateral ischemia.  His anginal equivalent was tightness in the arms during exercise -- similat to 2007 symptoms.  No SOB -- just an odd "not right sensation" Cardiac catheterization revealed significant multivessel disease, CT surgical consultation was offered, however the patient decided to proceed with multivessel PCI. He PCI of both the RCA and LAD with FFR of the OM 1. I saw him back in September, he is doing very well. He is back to his full-time exercise level.  Interval History: Today he comes in with actually no complaints. He is exercising almost everyday of the week. He usually 3 days a week will do gym exercises and then on to other days of the week will walk. He usually walks several miles on the weekends we'll do up to 4-5 miles.  He says that he feels great. No cardiac complaints. Cardiovascular ROS:  No chest pain or shortness of breath with rest or exertion. No PND, orthopnea or edema. No palpitations, lightheadedness, dizziness, weakness or syncope/near syncope. No TIA/amaurosis fugax symptoms. No melena, hematochezia hematuria.  Past Medical History  Diagnosis Date  . Hx of non-ST elevation myocardial infarction (NSTEMI) Sept 2007    Inf MI - distal RCA 90% - PCI; 60% rPDA; LCA minimal disease; Normal EF without WMA  . Presence of drug coated stent in right coronary artery Sept 2007    Cypher DES 3.0 mm x 13, 3.0 mm x 18  mm overlapping;  . CAD S/P percutaneous coronary angioplasty 06/29/12    Unstable Angina (High Risk Inferior Ischemia on Myoview) -- mid LAD 70% & 95% after D1; OM1 70%, RCA: prox 60%, mid 95% with ectasia, distal mid 60%, then distal Cypeher stents going into RPAV - patent;; FFR of OM1 ~0.8 (borderline significant)  . Presence of drug coated stent in right coronary artery 06/30/12    2 overlapping Promus Premier DES 3.0 mm x 24 mm -- 3.5 mm x 18 mm (dilated prox to distal to: 3.88 -- 4.1, 4.25 - 3.68)  . Presence of drug coated stent in LAD coronary artery 06/30/12    Promus Premier DES 2.25 mm x 28 mm - dilated to 2.45 mm  . HTN (hypertension)   . Hyperlipidemia LDL goal <70     Prior Cardiac Evaluation and Past Surgical History: Past Surgical History  Procedure Laterality Date  . Cataract extraction w/ intraocular lens  implant, bilateral Bilateral 1991-  1992  . Coronary angioplasty with stent placement  10/2005    "2";  Distal RCA: Cypher DES 3.0 mm x 13mm, 3.0 mm x 18 mm overlapping; (06/28/2012)  . Cardiac catheterization  06/28/2012  . Coronary angioplasty with stent placement  06/30/12    2 DES to proximal-mid RCA, 1 DES to LAD (see med history for details)  . Coronary angioplasty with stent placement  06/29/2012    2X STENTS  MEDICATIONS AND ALLERGIES REVIEWED IN EPIC No Change in Social and Family History ROS: A comprehensive Review of Systems - Negative except Mild allergy symptoms and having recovered from a recent cold  PHYSICAL EXAM BP 120/72  Pulse 62  Ht 5\' 5"  (1.651 m)  Wt 142 lb 11.2 oz (64.728 kg)  BMI 23.75 kg/m2 General appearance: alert, cooperative, appears stated age, no distress and Healthy-appearing. Well-nourished and well-groomed. He answers questions appropriate.  Neck: no adenopathy, no carotid bruit, no JVD and supple, symmetrical, trachea midline  Lungs: clear to auscultation bilaterally, normal percussion bilaterally and Good air movement, nonlabored.    Heart: regular rate and rhythm, S1, S2 normal, no murmur, click, rub or gallop and normal apical impulse  Abdomen: soft, non-tender; bowel sounds normal; no masses, no organomegaly  Extremities: extremities normal, atraumatic, no cyanosis or edema  Pulses: 2+ and symmetric  Neurologic: Grossly normal   Adult ECG Report  Rate: 62 ;  Rhythm: normal sinus rhythm  QRS Axis, PR Interval, QRS Duration, QTc: Normal; Voltages: Normal  Conduction Disturbances: none  Other Abnormalities: none   Narrative Interpretation: Normal EKG.  Recent Labs none since October:    ASSESSMENT / PLAN: Very stable status post multivessel PCI just under a year ago.  HYPERCHOLESTEROLEMIA Oct labs looked great - recheck in 1 yr (Oct 2015 - NMR panel).  CAD (coronary artery disease), native coronary artery, now with severe 90-95% mid RCA and mid LAD lesions 70-80% for PCI; FFR of OM1 ~70% lesion = 0.8 Ok to stop ASA after 1st 18 months. If $ issues arise, can convert to Plavix from Effient.  He is on beta blocker, ACE inhibitor and statin. He also didn't is back to his full exercise regimen which is a great way to determine the presence or absence of symptoms. He is quite independent with his angina equivalent.  Presence of drug coated stent in right coronary artery He has an upcoming colonoscopy. It should be safe for him to temporarily stop aspirin plus at the end for the colonoscopy after one year from the stent which would be in May. At that time we did this and make the switch to Plavix if he so desires. Possibly he could probably safely stop aspirin after 18 months from the PCI.  Angina, class II - III He had some atypical symptoms. We'll need to continue to monitor, however the stent is exercising he is doing, he is a great barometer to evaluate this.  HYPERTENSION Excellent control on Toprol and lisinopril.    Orders Placed This Encounter  Procedures  . NMR Lipoprofile with Lipids    Standing  Status: Future     Number of Occurrences:      Standing Expiration Date: 05/18/2014  . Comprehensive metabolic panel    Standing Status: Future     Number of Occurrences:      Standing Expiration Date: 05/18/2014    Order Specific Question:  Has the patient fasted?    Answer:  Yes  . EKG 12-Lead   No orders of the defined types were placed in this encounter.    Followup: 12 months  DAVID W. Herbie Baltimore, M.D., M.S. Interventional Cardiologist [facility]

## 2013-05-17 NOTE — Assessment & Plan Note (Addendum)
Ok to stop ASA after 1st 18 months. If $ issues arise, can convert to Plavix from Effient.  He is on beta blocker, ACE inhibitor and statin. He also didn't is back to his full exercise regimen which is a great way to determine the presence or absence of symptoms. He is quite independent with his angina equivalent.

## 2013-05-17 NOTE — Patient Instructions (Addendum)
Looking good.  Almost 1 year since last Stent. (5/7).  After that time, you should be OK to stop the Effient & ASA for Colonoscopy (Effient 7 d prior, ASA 5 days) - restart next day.  If Effient becomes expensive, we can covert to Plavix. You can also stop ASA after 18 months.  We will rechekc cholesterol panel in October - an NMR panel.  Labs NMR WITH LIPIDS AND CMP IN OCT 2015  I will see you back in ~1 year.  Keep up the exercise.  Marykay LexHARDING,DAVID W, MD   .Your physician wants you to follow-up in: 12 months. You will receive a reminder letter in the mail two months in advance. If you don't receive a letter, please call our office to schedule the follow-up appointment.   12

## 2013-05-19 NOTE — Assessment & Plan Note (Signed)
Excellent control on Toprol and lisinopril.

## 2013-05-19 NOTE — Assessment & Plan Note (Signed)
He has an upcoming colonoscopy. It should be safe for him to temporarily stop aspirin plus at the end for the colonoscopy after one year from the stent which would be in May. At that time we did this and make the switch to Plavix if he so desires. Possibly he could probably safely stop aspirin after 18 months from the PCI.

## 2013-05-19 NOTE — Assessment & Plan Note (Signed)
He had some atypical symptoms. We'll need to continue to monitor, however the stent is exercising he is doing, he is a great barometer to evaluate this.

## 2013-07-26 ENCOUNTER — Other Ambulatory Visit: Payer: Self-pay | Admitting: *Deleted

## 2013-07-26 MED ORDER — ATORVASTATIN CALCIUM 20 MG PO TABS: ORAL_TABLET | ORAL | Status: DC

## 2013-07-26 NOTE — Telephone Encounter (Signed)
Rx was sent to pharmacy electronically. 

## 2013-11-02 ENCOUNTER — Telehealth: Payer: Self-pay | Admitting: *Deleted

## 2013-11-02 DIAGNOSIS — E7800 Pure hypercholesterolemia, unspecified: Secondary | ICD-10-CM

## 2013-11-02 DIAGNOSIS — Z79899 Other long term (current) drug therapy: Secondary | ICD-10-CM

## 2013-11-02 NOTE — Telephone Encounter (Signed)
Mail letter and lab slip for CMP, NMR WITH LIPIDS due in OCT 2015

## 2013-11-02 NOTE — Telephone Encounter (Signed)
Message copied by Tobin Chad on Fri Nov 02, 2013  9:53 AM ------      Message from: Tobin Chad      Created: Thu May 17, 2013  8:33 AM       NMR WITH LIPIDS       CMP LABS MAIL             LABS DUE IN 0CT 2015 ------

## 2013-11-02 NOTE — Telephone Encounter (Signed)
OPEN IN ERROR 

## 2013-11-19 ENCOUNTER — Other Ambulatory Visit: Payer: Self-pay | Admitting: Cardiology

## 2013-11-19 NOTE — Telephone Encounter (Signed)
Rx was sent to pharmacy electronically. 

## 2013-11-20 ENCOUNTER — Other Ambulatory Visit: Payer: Self-pay | Admitting: Cardiology

## 2013-11-21 ENCOUNTER — Other Ambulatory Visit: Payer: Self-pay | Admitting: Cardiology

## 2013-11-21 NOTE — Telephone Encounter (Signed)
Rx was sent to pharmacy electronically. 

## 2013-11-22 ENCOUNTER — Other Ambulatory Visit: Payer: Self-pay | Admitting: Cardiology

## 2013-12-26 LAB — COMPREHENSIVE METABOLIC PANEL
ALT: 28 U/L (ref 0–53)
AST: 34 U/L (ref 0–37)
Albumin: 4.4 g/dL (ref 3.5–5.2)
Alkaline Phosphatase: 64 U/L (ref 39–117)
BUN: 14 mg/dL (ref 6–23)
CALCIUM: 9.5 mg/dL (ref 8.4–10.5)
CO2: 26 mEq/L (ref 19–32)
Chloride: 103 mEq/L (ref 96–112)
Creat: 0.84 mg/dL (ref 0.50–1.35)
GLUCOSE: 89 mg/dL (ref 70–99)
Potassium: 4.9 mEq/L (ref 3.5–5.3)
Sodium: 140 mEq/L (ref 135–145)
Total Bilirubin: 0.7 mg/dL (ref 0.2–1.2)
Total Protein: 7 g/dL (ref 6.0–8.3)

## 2013-12-28 LAB — NMR LIPOPROFILE WITH LIPIDS
CHOLESTEROL, TOTAL: 183 mg/dL (ref 100–199)
HDL Particle Number: 47 umol/L (ref >=30.5)
HDL SIZE: 9.1 nm — AB (ref >=9.2)
HDL-C: 68 mg/dL (ref >39)
LARGE HDL: 7.2 umol/L (ref >=4.8)
LARGE VLDL-P: 2.5 nmol/L (ref <=2.7)
LDL (calc): 101 mg/dL — ABNORMAL HIGH (ref 0–99)
LDL Particle Number: 1061 nmol/L — ABNORMAL HIGH (ref <1000)
LDL Size: 20.6 nm (ref >=20.8)
LP-IR Score: 41 (ref <=45)
Small LDL Particle Number: 433 nmol/L (ref <=527)
Triglycerides: 72 mg/dL (ref 0–149)
VLDL Size: 45.7 nm (ref <=46.6)

## 2014-01-10 ENCOUNTER — Other Ambulatory Visit: Payer: Self-pay | Admitting: Cardiology

## 2014-01-10 NOTE — Telephone Encounter (Signed)
Rx has been sent to the pharmacy electronically. ° °

## 2014-01-11 ENCOUNTER — Telehealth: Payer: Self-pay | Admitting: *Deleted

## 2014-01-11 MED ORDER — ATORVASTATIN CALCIUM 40 MG PO TABS: 40.0000 mg | ORAL_TABLET | Freq: Every day | ORAL | Status: DC

## 2014-01-11 NOTE — Telephone Encounter (Signed)
Left message with wife to have patient to call back concerning results.

## 2014-01-11 NOTE — Telephone Encounter (Signed)
Spoke to patient. Result given . Verbalized understanding Patient states he increased atorvastatin to 40 mg RN informed patient - will e-sent new prescription to [facility] for 40 mg  #30 supply.

## 2014-01-11 NOTE — Telephone Encounter (Signed)
-----   Message from Marykay Lexavid W Harding, MD sent at 01/03/2014  6:06 PM EST ----- Unfortunately - the "Great results last October were wishful thinking. LDL is now ~101 (up from 76) -- what we so know is that this screening # does seem to correlated with the NMR panel's LDL Particle #.    Our goal for both readings are < 70 & < 700.  I would like to try increasing the atorvastatin dose to 40 mg.  Marykay LexHARDING,DAVID W, MD

## 2014-01-31 ENCOUNTER — Encounter (HOSPITAL_COMMUNITY): Payer: Self-pay | Admitting: Cardiology

## 2014-03-28 ENCOUNTER — Telehealth: Payer: Self-pay | Admitting: Cardiology

## 2014-03-28 NOTE — Telephone Encounter (Signed)
Close encounter 

## 2014-04-24 ENCOUNTER — Ambulatory Visit (INDEPENDENT_AMBULATORY_CARE_PROVIDER_SITE_OTHER): Payer: BC Managed Care – PPO | Admitting: Family Medicine

## 2014-04-24 ENCOUNTER — Encounter: Payer: Self-pay | Admitting: Family Medicine

## 2014-04-24 VITALS — BP 127/75 | HR 60 | Temp 98.6°F | Ht 65.0 in | Wt 142.1 lb

## 2014-04-24 DIAGNOSIS — I10 Essential (primary) hypertension: Secondary | ICD-10-CM

## 2014-04-24 DIAGNOSIS — Z114 Encounter for screening for human immunodeficiency virus [HIV]: Secondary | ICD-10-CM

## 2014-04-24 DIAGNOSIS — E7800 Pure hypercholesterolemia, unspecified: Secondary | ICD-10-CM

## 2014-04-24 DIAGNOSIS — E782 Mixed hyperlipidemia: Secondary | ICD-10-CM

## 2014-04-24 NOTE — Assessment & Plan Note (Signed)
Check flp since was not at goal and had lipitor increased in Nov Has follow up with cardiology

## 2014-04-24 NOTE — Patient Instructions (Signed)
Good to see you today!  Thanks for coming in.  Your major health task is to get your colonoscopy - please call if any problems.  Call your insurance company [facility] or [facility]  I will call you if your tests are not good.  Otherwise I will send you a letter.  If you do not hear from me with in 2 weeks please call our office.      Keep doing what you are doing

## 2014-04-24 NOTE — Assessment & Plan Note (Signed)
Well controlled at goal Continue current medications  BP Readings from Last 3 Encounters:  04/24/14 127/75  05/17/13 120/72  11/09/12 110/70

## 2014-04-24 NOTE — Progress Notes (Signed)
   Subjective:    Patient ID: [name], male    DOB: 09-12-1955, 59 y.o.   MRN: [id number]  HPI  For CPE  Generally feeling well.  Exercising daily and watching his weight.  Knows he needs colonoscopy and has plan   HYPERLIPIDEMIA Symptoms Chest pain on exertion:  No   Leg claudication:   no Medications: Compliance- recenlty had lipitor increased to 40 mg dalily Right upper quadrant pain- no  Muscle aches- no     Component Value Date/Time   CHOL 183 12/26/2013 0823   CHOL 139 12/01/2012 0823   TRIG 72 12/26/2013 0823   TRIG 53 12/01/2012 0823   HDL 68 12/26/2013 0823   HDL 52 12/01/2012 0823   VLDL 11 12/01/2012 0823   CHOLHDL 2.7 12/01/2012 0823   Patient reports no  vision/ hearing changes,anorexia, weight change, fever ,adenopathy, persistant / recurrent hoarseness, swallowing issues, chest pain, edema,persistant / recurrent cough, hemoptysis, dyspnea(rest, exertional, paroxysmal nocturnal), gastrointestinal  bleeding (melena, rectal bleeding), abdominal pain, excessive heart burn, GU symptoms(dysuria, hematuria, pyuria, voiding/incontinence  Issues) syncope, focal weakness, severe memory loss, concerning skin lesions, depression, anxiety, abnormal bruising/bleeding, major joint swelling.    Chief Complaint noted Review of Symptoms - see HPI PMH - Smoking status noted.   Vital Signs reviewed   Review of Systems     Objective:   Physical Exam Heart - Regular rate and rhythm.  No murmurs, gallops or rubs.    Lungs:  Normal respiratory effort, chest expands symmetrically. Lungs are clear to auscultation, no crackles or wheezes. Extremities:  No cyanosis, edema, or deformity noted with good range of motion of all major joints.   Abdomen: soft and non-tender without masses, organomegaly or hernias noted.  No guarding or rebound Neck:  No deformities, thyromegaly, masses, or tenderness noted.   Supple with full range of motion without pain.        Assessment &  Plan:

## 2014-04-25 ENCOUNTER — Encounter: Payer: Self-pay | Admitting: Family Medicine

## 2014-04-25 LAB — HIV ANTIBODY (ROUTINE TESTING W REFLEX): HIV: NONREACTIVE

## 2014-04-25 LAB — LIPID PANEL
CHOL/HDL RATIO: 2.6 ratio
CHOLESTEROL: 151 mg/dL (ref 0–200)
HDL: 59 mg/dL (ref >=40)
LDL Cholesterol: 79 mg/dL (ref 0–99)
TRIGLYCERIDES: 64 mg/dL (ref <150)
VLDL: 13 mg/dL (ref 0–40)

## 2014-06-10 ENCOUNTER — Encounter: Payer: Self-pay | Admitting: Cardiology

## 2014-06-10 ENCOUNTER — Ambulatory Visit (INDEPENDENT_AMBULATORY_CARE_PROVIDER_SITE_OTHER): Payer: BC Managed Care – PPO | Admitting: Cardiology

## 2014-06-10 VITALS — BP 140/70 | HR 76 | Ht 65.0 in | Wt 144.0 lb

## 2014-06-10 DIAGNOSIS — E782 Mixed hyperlipidemia: Secondary | ICD-10-CM | POA: Diagnosis not present

## 2014-06-10 DIAGNOSIS — I10 Essential (primary) hypertension: Secondary | ICD-10-CM | POA: Diagnosis not present

## 2014-06-10 DIAGNOSIS — E785 Hyperlipidemia, unspecified: Secondary | ICD-10-CM | POA: Diagnosis not present

## 2014-06-10 DIAGNOSIS — I2510 Atherosclerotic heart disease of native coronary artery without angina pectoris: Secondary | ICD-10-CM

## 2014-06-10 DIAGNOSIS — Z955 Presence of coronary angioplasty implant and graft: Secondary | ICD-10-CM

## 2014-06-10 NOTE — Assessment & Plan Note (Signed)
Stable with no recurrent SSx of Angina.  On ASA, Statin, BB & ACE-I. OK to stop ASA since he remains of Effient. Back to full exercise regimen.

## 2014-06-10 NOTE — Assessment & Plan Note (Signed)
For the most part, has been well controlled.  A bit high today, but is quite excited b/c upcoming trip. No change to current medications.

## 2014-06-10 NOTE — Assessment & Plan Note (Signed)
Still on DAPT -> OK to stop ASA.   If $$ concerns with Effient, could convert to Plavix.

## 2014-06-10 NOTE — Progress Notes (Signed)
PCP: Carney LivingHAMBLISS,MARSHALL L, MD  Clinic Note: Chief Complaint  Patient presents with  . Annual Exam    brought recent blood work, no chest pain or tightness or leg swelling or pain  . Coronary Artery Disease  . Hyperlipidemia    HPI: [name] is a 59 y.o. male with a PMH below who presents today for ~1 yr f/u for CAD - PCI to RCA (2 Cypher DES in 2007 for ACS).  Myoview May 2014 - High Risk for Class II Angina Sx. --> Cath with MV CAD --> MV PCI RCA & LAD (non-ischemic FFR of OM1).  Past Medical History  Diagnosis Date  . Hx of non-ST elevation myocardial infarction (NSTEMI) Sept 2007    Inf MI - distal RCA 90% - PCI; 60% rPDA; LCA minimal disease; Normal EF without WMA  . Presence of drug coated stent in right coronary artery Sept 2007    Cypher DES 3.0 mm x 13, 3.0 mm x 18 mm overlapping;  . CAD S/P percutaneous coronary angioplasty 06/29/12    Unstable Angina (High Risk Inferior Ischemia on Myoview) -- mid LAD 70% & 95% after D1; OM1 70%, RCA: prox 60%, mid 95% with ectasia, distal mid 60%, then distal Cypeher stents going into RPAV - patent;; FFR of OM1 ~0.8 (borderline significant)  . Presence of drug coated stent in right coronary artery 06/30/12    2 overlapping Promus Premier DES 3.0 mm x 24 mm -- 3.5 mm x 18 mm (dilated prox to distal to: 3.88 -- 4.1, 4.25 - 3.68)  . Presence of drug coated stent in LAD coronary artery 06/30/12    Promus Premier DES 2.25 mm x 28 mm - dilated to 2.45 mm  . HTN (hypertension)   . Hyperlipidemia LDL goal <70     Prior Cardiac Evaluation and Past Surgical History: Past Surgical History  Procedure Laterality Date  . Cataract extraction w/ intraocular lens  implant, bilateral Bilateral 1991-  1992  . Coronary angioplasty with stent placement  10/2005    "2";  Distal RCA: Cypher DES 3.0 mm x 13mm, 3.0 mm x 18 mm overlapping; (06/28/2012)  . Cardiac catheterization  06/28/2012  . Coronary angioplasty with stent placement  06/30/12    2 DES to  proximal-mid RCA, 1 DES to LAD (see med history for details)  . Coronary angioplasty with stent placement  06/29/2012    2X STENTS  . Left heart catheterization with coronary angiogram N/A 06/28/2012    Procedure: LEFT HEART CATHETERIZATION WITH CORONARY ANGIOGRAM;  Surgeon: Marykay Lexavid W Laster Appling, MD;  [facility];  Service: Cardiovascular;  Laterality: N/A;  . Percutaneous coronary stent intervention (pci-s) N/A 06/29/2012    Procedure: PERCUTANEOUS CORONARY STENT INTERVENTION (PCI-S);  Surgeon: Marykay Lexavid W Mckinzy Fuller, MD;  [facility];  Service: Cardiovascular;  Laterality: N/A;    Interval History: Still very active.  Getting ready to go [place] trip to [place] with [place]. No active Cardiac complaints.  Cardiovascular ROS: no chest pain or dyspnea on exertion negative for - edema, irregular heartbeat, loss of consciousness, murmur, orthopnea, palpitations, paroxysmal nocturnal dyspnea, rapid heart rate, shortness of breath or TIA/amaurosis fugax, syncope /near syncope.  ROS: A comprehensive was performed. Review of Systems  Constitutional: Negative.   Respiratory: Negative for cough, sputum production and wheezing.   Cardiovascular: Negative for claudication.  Gastrointestinal: Negative for blood in stool and melena.  Genitourinary: Negative for hematuria.  Musculoskeletal: Negative.  Negative for myalgias and joint  pain.  Neurological: Negative.  Negative for dizziness and loss of consciousness.  Endo/Heme/Allergies: Bruises/bleeds easily.  Psychiatric/Behavioral: Negative.   All other systems reviewed and are negative.   Current Outpatient Prescriptions on File Prior to Visit  Medication Sig Dispense Refill  . aspirin 81 MG chewable tablet Chew 1 tablet (81 mg total) by mouth daily.    [name] atorvastatin (LIPITOR) 40 MG tablet Take 1 tablet (40 mg total) by mouth daily. 30 tablet 11  . EFFIENT 10 MG TABS tablet TAKE 1 TABLET (10 MG TOTAL) BY MOUTH DAILY. 30 tablet 6  . metoprolol  succinate (TOPROL-XL) 25 MG 24 hr tablet TAKE 1 TABLET (25 MG TOTAL) BY MOUTH DAILY. 30 tablet 6  . NITROSTAT 0.4 MG SL tablet TAKE UP TO 3 TABS 5 MINUTES APART AS NEEDED FOR CHEST PAIN 25 tablet 10  . ramipril (ALTACE) 10 MG capsule TAKE 1 CAPSULE (10 MG TOTAL) BY MOUTH DAILY. 30 capsule 6   No current facility-administered medications on file prior to visit.   Allergies  Allergen Reactions  . Simvastatin Other (See Comments)    Dizzy, spaced out     History  Substance Use Topics  . Smoking status: Former Smoker -- 1.00 packs/day for 15 years    Types: Cigarettes    Quit date: 02/23/1992  . Smokeless tobacco: Never Used  . Alcohol Use: 3.6 oz/week    3 Glasses of wine, 3 Cans of beer per week     Comment: 06/28/2012 "couple glass of wine or beer maybe 3X/wk"   Family History  Problem Relation Age of Onset  . Heart attack Father 32    died of acute mi  . Coronary artery disease Brother 50    stents placed   Wt Readings from Last 3 Encounters:  06/10/14 144 lb (65.318 kg)  04/24/14 142 lb 2 oz (64.467 kg)  05/17/13 142 lb 11.2 oz (64.728 kg)   PHYSICAL EXAM BP 140/70 mmHg  Pulse 76  Ht  (1.651 m)  Wt 144 lb (65.318 kg)  BMI 23.96 kg/m2 General appearance: alert, cooperative, appears stated age, no distress and Healthy-appearing. Well-nourished and well-groomed. He answers questions appropriate.  HEENT: [place]/AT, EOMI, MMM, anicteric sclera Neck: no adenopathy, no carotid bruit, no JVD and supple, symmetrical, trachea midline  Lungs: clear to auscultation bilaterally, normal percussion bilaterally and Good air movement, nonlabored.  Heart: regular rate and rhythm, S1, S2 normal, no murmur, click, rub or gallop and normal apical impulse  Abdomen: soft, non-tender; bowel sounds normal; no masses, no organomegaly  Extremities: extremities normal, atraumatic, no cyanosis or edema  Pulses: 2+ and symmetric  Neurologic: Grossly normal; Psych: Normal mood & affet    Adult  ECG Report  Rate: 76 ;  Rhythm: normal sinus rhythm and normal axis, intervals & durations.  Normal voltage  Narrative Interpretation: Normal EKG  Recent Labs:   Lab Results  Component Value Date   CHOL 151 04/24/2014   HDL 59 04/24/2014   LDLCALC 79 04/24/2014   TRIG 64 04/24/2014   CHOLHDL 2.6 04/24/2014    ASSESSMENT / PLAN: Problem List Items Addressed This Visit    CAD (coronary artery disease), native coronary artery, now with severe 90-95% mid RCA and mid LAD lesions 70-80% for PCI; FFR of OM1 ~70% lesion = 0.8 - Primary (Chronic)    Stable with no recurrent SSx of Angina.  On ASA, Statin, BB & ACE-I. OK to stop ASA since he remains of Effient. Back to full  exercise regimen.       Relevant Orders   EKG 12-Lead   Essential hypertension (Chronic)    For the most part, has been well controlled.  A bit high today, but is quite excited b/c upcoming trip. No change to current medications.      Relevant Orders   EKG 12-Lead   Hyperlipidemia with target LDL less than 70 (Chronic)    Now on Lipitor - after a bit of a jump last year, labs look much better this year.  See above. Monitored by PCP - almost @ goal.  Would consider NMR panel for next check to determine if calculated values match true levels.      Relevant Orders   EKG 12-Lead   Presence of drug coated stent in RCA and LAD (Chronic)    Still on DAPT -> OK to stop ASA.   If $$ concerns with Effient, could convert to Plavix.         Orders Placed This Encounter  Procedures  . EKG 12-Lead   No orders of the defined types were placed in this encounter.     Followup: 1 yr    Marykay Lex, M.D., M.S. Interventional Cardiologist   Pager # [phone]

## 2014-06-10 NOTE — Assessment & Plan Note (Signed)
Now on Lipitor - after a bit of a jump last year, labs look much better this year.  See above. Monitored by PCP - almost @ goal.  Would consider NMR panel for next check to determine if calculated values match true levels.

## 2014-06-10 NOTE — Patient Instructions (Signed)
May stop taking aspirin.  Okay to have colonoscopy.  Your physician wants you to follow-up in 12 month DR HARDING.  You will receive a reminder letter in the mail two months in advance. If you don't receive a letter, please call our office to schedule the follow-up appointment.

## 2014-06-12 ENCOUNTER — Telehealth: Payer: Self-pay | Admitting: Physician Assistant

## 2014-06-12 ENCOUNTER — Telehealth: Payer: Self-pay | Admitting: Cardiology

## 2014-06-12 ENCOUNTER — Other Ambulatory Visit: Payer: Self-pay | Admitting: Cardiology

## 2014-06-12 NOTE — Telephone Encounter (Signed)
Medication refilled. Called to inform patient, call not answered.

## 2014-06-12 NOTE — Telephone Encounter (Signed)
Hit the wrong provider by mistake °

## 2014-06-12 NOTE — Telephone Encounter (Signed)
Effient, metoprolol, ramipril refilled #30 with 6 refills 06/12/14 Atorvastatin refilled #30 with 11 refills on 01/11/2014 Left message for patient with this info Called pharmacy and they have these meds and refills on file

## 2014-06-12 NOTE — Telephone Encounter (Signed)
Pt left message with answering service,he need to refill his medicine.

## 2014-06-12 NOTE — Telephone Encounter (Signed)
°  1. Which medications need to be refilled?Effient,Atorvastatin,Metoprolol and Ramipril  2. Which pharmacy is medication to be sent to?[facility]  3. Do they need a 30 day or 90 day supply? 30 and refills  4. Would they like a call back once the medication has been sent to the pharmacy? Yes,if not there [phone]

## 2014-06-13 ENCOUNTER — Other Ambulatory Visit: Payer: Self-pay | Admitting: *Deleted

## 2014-09-09 ENCOUNTER — Telehealth: Payer: Self-pay | Admitting: Cardiology

## 2014-09-09 NOTE — Telephone Encounter (Signed)
Spoke to patient He states he will be having colonoscopy next week Tuesday 09/17/14 by Dr Dulce Sellarutlaw. Per patient, Dr Dulce Sellarutlaw informed patient to hold Effient for 7 days. Patient wanted to know if it was okay and went to restart if he has polyp. RN informed patient that Dr Dulce Sellarutlaw will let him know when to restart after procedure Will defer  to Dr Donnajean LopesHarding,if other instruction pertaining to holding Effient.

## 2014-09-09 NOTE — Telephone Encounter (Signed)
Pt called in stating that he will be having a colonoscopy next week and he needs some instructions which medications need to be held and when he can start back taking them after his procedure. Please call  Thanks

## 2014-09-10 NOTE — Telephone Encounter (Signed)
RN reviewed with Dr Allyson SabalBerry ( d.o.d) in Dr Herbie BaltimoreHARDING abscence. Okay to hold EFFIENT as Dr Dulce SellarUTLAW as ordered for colonoscopy. Patient aware verbalized understanding.

## 2015-01-18 ENCOUNTER — Other Ambulatory Visit: Payer: Self-pay | Admitting: Cardiology

## 2015-01-20 NOTE — Telephone Encounter (Signed)
Rx request sent to pharmacy.  

## 2015-04-18 ENCOUNTER — Other Ambulatory Visit: Payer: Self-pay | Admitting: Cardiology

## 2015-04-18 MED ORDER — METOPROLOL SUCCINATE ER 25 MG PO TB24: 25.0000 mg | ORAL_TABLET | Freq: Every day | ORAL | Status: DC

## 2015-04-18 MED ORDER — RAMIPRIL 10 MG PO CAPS: 10.0000 mg | ORAL_CAPSULE | Freq: Every day | ORAL | Status: DC

## 2015-04-22 ENCOUNTER — Other Ambulatory Visit: Payer: Self-pay | Admitting: *Deleted

## 2015-04-22 MED ORDER — ATORVASTATIN CALCIUM 40 MG PO TABS: 40.0000 mg | ORAL_TABLET | Freq: Every day | ORAL | Status: DC

## 2015-06-09 NOTE — Progress Notes (Signed)
PCP: Carney Living, MD  Clinic Note: Chief Complaint  Patient presents with  . Annual Exam    pt denied chest pain and SOB, pt denied swelling in feet and ankles  . Coronary Artery Disease    s/p PCI    HPI: [name] is a 60 y.o. male with a PMH below who presents today for annual f/u for CAD - PCI to RCA (2 Cypher DES in 2007 for ACS).  Myoview May 2014 - High Risk for Class II Angina Sx. --> Cath with MV CAD --> MV PCI RCA & LAD (non-ischemic FFR of OM1).  [name] was last seen on June 10, 2014. He was doing well with no major complaints.  Shortly after his last visit, he went on his trip to [place] - loved it.  He brought in a picture of one of the [place] ruins for me.  Recent Hospitalizations: None  Studies Reviewed: None  Interval History: Feels very good. No cardiac complaints.  He pretty much continues to work out @ the gym daily.  He also walks to & from work in AM-PM as well as for lunch.  Maintaining a healthy diet (but did admit to some indiscretions during his [place] trip).  Cardiovascular Review of Symptoms: No chest pain or shortness of breath with rest or exertion.  No PND, orthopnea or edema.  No palpitations, lightheadedness, dizziness, weakness, yncope/near syncope, or TIA/amaurosis fugax symptoms. No melena, hematochezia, hematuria, or epstaxis. No claudication.   ROS: A comprehensive was performed. Review of Systems  Constitutional: Negative for malaise/fatigue.  HENT: Positive for congestion. Negative for nosebleeds.        Pollen Allergies  Eyes: Positive for redness.  Respiratory: Negative for cough (some with pollen ), shortness of breath and wheezing.   Cardiovascular: Negative for claudication.  Gastrointestinal: Negative for heartburn, abdominal pain, diarrhea and constipation.       Recent colonoscopy - no abnormal findings  Musculoskeletal: Negative for myalgias, joint pain and falls.  Neurological: Negative for  dizziness and headaches.  Endo/Heme/Allergies: Does not bruise/bleed easily.  Psychiatric/Behavioral: Negative for depression and memory loss. The patient is not nervous/anxious and does not have insomnia.   All other systems reviewed and are negative.    Past Medical History  Diagnosis Date  . Hx of non-ST elevation myocardial infarction (NSTEMI) Sept 2007    Inf MI - distal RCA 90% - PCI; 60% rPDA; LCA minimal disease; Normal EF without WMA  . Presence of drug coated stent in right coronary artery Sept 2007    Cypher DES 3.0 mm x 13, 3.0 mm x 18 mm overlapping;  . CAD S/P percutaneous coronary angioplasty 06/29/12    Unstable Angina (High Risk Inferior Ischemia on Myoview) -- mid LAD 70% & 95% after D1; OM1 70%, RCA: prox 60%, mid 95% with ectasia, distal mid 60%, then distal Cypeher stents going into RPAV - patent;; FFR of OM1 ~0.8 (borderline significant)  . Presence of drug coated stent in right coronary artery 06/30/12    2 overlapping Promus Premier DES 3.0 mm x 24 mm -- 3.5 mm x 18 mm (dilated prox to distal to: 3.88 -- 4.1, 4.25 - 3.68)  . Presence of drug coated stent in LAD coronary artery 06/30/12    Promus Premier DES 2.25 mm x 28 mm - dilated to 2.45 mm  . HTN (hypertension)   . Hyperlipidemia LDL goal <70     Past Surgical History  Procedure Laterality Date  .  Cataract extraction w/ intraocular lens  implant, bilateral Bilateral 1991-  1992  . Coronary angioplasty with stent placement  10/2005    "2";  Distal RCA: Cypher DES 3.0 mm x 13mm, 3.0 mm x 18 mm overlapping; (06/28/2012)  . Cardiac catheterization  06/28/2012  . Coronary angioplasty with stent placement  06/30/12    2 DES to proximal-mid RCA, 1 DES to LAD (see med history for details)  . Coronary angioplasty with stent placement  06/29/2012    2X STENTS  . Left heart catheterization with coronary angiogram N/A 06/28/2012    Procedure: LEFT HEART CATHETERIZATION WITH CORONARY ANGIOGRAM;  Surgeon: Marykay Lexavid W Harding, MD;   [facility];  Service: Cardiovascular;  Laterality: N/A;  . Percutaneous coronary stent intervention (pci-s) N/A 06/29/2012    Procedure: PERCUTANEOUS CORONARY STENT INTERVENTION (PCI-S);  Surgeon: Marykay Lexavid W Harding, MD;  [facility];  Service: Cardiovascular;  Laterality: N/A;    Prior to Admission medications   Medication Sig Start Date End Date Taking? Authorizing Provider  atorvastatin (LIPITOR) 40 MG tablet Take 1 tablet (40 mg total) by mouth daily. 04/22/15  Yes Marykay Lexavid W Harding, MD  EFFIENT 10 MG TABS tablet TAKE 1 TABLET EVERY DAY 01/20/15  Yes Marykay Lexavid W Harding, MD  metoprolol succinate (TOPROL-XL) 25 MG 24 hr tablet Take 1 tablet (25 mg total) by mouth daily. 04/18/15  Yes Marykay Lexavid W Harding, MD  NITROSTAT 0.4 MG SL tablet TAKE UP TO 3 TABS 5 MINUTES APART AS NEEDED FOR CHEST PAIN 09/03/12  Yes Marykay Lexavid W Harding, MD  ramipril (ALTACE) 10 MG capsule Take 1 capsule (10 mg total) by mouth daily. 04/18/15  Yes Marykay Lexavid W Harding, MD    Allergies  Allergen Reactions  . Simvastatin Other (See Comments)    Dizzy, spaced out    Social History   Social History  . Marital Status: Married    Spouse Name: N/A  . Number of Children: 0  . Years of Education: N/A   Occupational History  . works at [employer]   Social History Main Topics  . Smoking status: Former Smoker -- 1.00 packs/day for 15 years    Types: Cigarettes    Quit date: 02/23/1992  . Smokeless tobacco: Never Used  . Alcohol Use: 3.6 oz/week    3 Glasses of wine, 3 Cans of beer per week     Comment: 06/28/2012 "couple glass of wine or beer maybe 3X/wk"  . Drug Use: No  . Sexual Activity: Yes   Other Topics Concern  . None   Social History Narrative   Family History  Problem Relation Age of Onset  . Heart attack Father 2661    died of acute mi  . Coronary artery disease Brother 50    stents placed    Wt Readings from Last 3 Encounters:  06/10/15 142 lb 3.2 oz (64.501 kg)  06/10/14 144 lb (65.318  kg)  04/24/14 142 lb 2 oz (64.467 kg)    PHYSICAL EXAM BP 148/76 mmHg  Pulse 77  Ht 5\' 5"  (1.651 m)  Wt 142 lb 3.2 oz (64.501 kg)  BMI 23.66 kg/m2 General appearance: alert, cooperative, appears stated age, no distress and Healthy-appearing. Well-nourished and well-groomed. He answers questions appropriate.  HEENT: [place]/AT, EOMI, MMM, anicteric sclera Neck: no adenopathy, no carotid bruit, no JVD and supple, symmetrical, trachea midline  Lungs: clear to auscultation bilaterally, normal percussion bilaterally and Good air movement, nonlabored.  Heart: regular rate and rhythm, S1, S2 normal,  no murmur, click, rub or gallop and normal apical impulse  Abdomen: soft, non-tender; bowel sounds normal; no masses, no organomegaly  Extremities: extremities normal, atraumatic, no cyanosis or edema  Pulses: 2+ and symmetric  Neurologic: Grossly normal; Psych: Normal mood & affet    Adult ECG Report  Rate: 77 ;  Rhythm: normal sinus rhythm and normal axis, durations & intervals. ;   Narrative Interpretation: Normal / Stable EKG   Other studies Reviewed: Additional studies/ records that were reviewed today include:  Recent Labs:  Due for check by PCP soon Lab Results  Component Value Date   CHOL 151 04/24/2014   HDL 59 04/24/2014   LDLCALC 79 04/24/2014   TRIG 64 04/24/2014   CHOLHDL 2.6 04/24/2014    ASSESSMENT / PLAN: Problem List Items Addressed This Visit    Presence of drug coated stent in RCA and LAD (Chronic)   Relevant Orders   EKG 12-Lead   Hyperlipidemia with target LDL less than 70 (Chronic)    Pretty close to goal last year. Due for follow-up labs from PCP ~ next month. Continue Atorvastatin.      Relevant Medications   nitroGLYCERIN (NITROSTAT) 0.4 MG SL tablet   ramipril (ALTACE) 10 MG capsule   metoprolol succinate (TOPROL-XL) 25 MG 24 hr tablet   atorvastatin (LIPITOR) 40 MG tablet   Other Relevant Orders   EKG 12-Lead   Essential hypertension (Chronic)     BP is a bit high today - but he just left the gym.  Usually in the 120 mmHg range. On Toprol & Rampril.      Relevant Medications   nitroGLYCERIN (NITROSTAT) 0.4 MG SL tablet   ramipril (ALTACE) 10 MG capsule   metoprolol succinate (TOPROL-XL) 25 MG 24 hr tablet   atorvastatin (LIPITOR) 40 MG tablet   Other Relevant Orders   EKG 12-Lead   CAD (coronary artery disease), native coronary artery, now with severe 90-95% mid RCA and mid LAD lesions 70-80% for PCI; FFR of OM1 ~70% lesion = 0.8 - Primary (Chronic)    Stable - doing well with no recurrent angina or CHF symptoms.  No bleeding on Effient - no ASA. Continue statin, BB & ACE-I. Continues active exercise regimen.      Relevant Medications   nitroGLYCERIN (NITROSTAT) 0.4 MG SL tablet   ramipril (ALTACE) 10 MG capsule   metoprolol succinate (TOPROL-XL) 25 MG 24 hr tablet   atorvastatin (LIPITOR) 40 MG tablet   Other Relevant Orders   EKG 12-Lead      Current medicines are reviewed at length with the patient today. (+/- concerns) none The following changes have been made: none Studies Ordered:   Orders Placed This Encounter  Procedures  . EKG 12-Lead   ROV 1 yr.   Marykay Lex, M.D., M.S. Interventional Cardiologist   Pager # [phone] Phone # [phone] [address] [place], [place] [address]

## 2015-06-10 ENCOUNTER — Encounter: Payer: Self-pay | Admitting: Cardiology

## 2015-06-10 ENCOUNTER — Ambulatory Visit (INDEPENDENT_AMBULATORY_CARE_PROVIDER_SITE_OTHER): Payer: BC Managed Care – PPO | Admitting: Cardiology

## 2015-06-10 VITALS — BP 148/76 | HR 77 | Ht 65.0 in | Wt 142.2 lb

## 2015-06-10 DIAGNOSIS — Z955 Presence of coronary angioplasty implant and graft: Secondary | ICD-10-CM | POA: Diagnosis not present

## 2015-06-10 DIAGNOSIS — E785 Hyperlipidemia, unspecified: Secondary | ICD-10-CM | POA: Diagnosis not present

## 2015-06-10 DIAGNOSIS — I10 Essential (primary) hypertension: Secondary | ICD-10-CM | POA: Diagnosis not present

## 2015-06-10 DIAGNOSIS — I25119 Atherosclerotic heart disease of native coronary artery with unspecified angina pectoris: Secondary | ICD-10-CM

## 2015-06-10 MED ORDER — ATORVASTATIN CALCIUM 40 MG PO TABS: 40.0000 mg | ORAL_TABLET | Freq: Every day | ORAL | Status: DC

## 2015-06-10 MED ORDER — RAMIPRIL 10 MG PO CAPS: 10.0000 mg | ORAL_CAPSULE | Freq: Every day | ORAL | Status: DC

## 2015-06-10 MED ORDER — NITROGLYCERIN 0.4 MG SL SUBL: 0.4000 mg | SUBLINGUAL_TABLET | SUBLINGUAL | Status: DC | PRN

## 2015-06-10 MED ORDER — METOPROLOL SUCCINATE ER 25 MG PO TB24: 25.0000 mg | ORAL_TABLET | Freq: Every day | ORAL | Status: DC

## 2015-06-10 NOTE — Patient Instructions (Signed)
No changes to current medications  NTG was refilled  PLEASE HAVE LABS SENT TO OFFICE WHEN COMPLETED BY PRIMARY  Your physician wants you to follow-up in 12 months with DR HARDING. -30 MIN  You will receive a reminder letter in the mail two months in advance. If you don't receive a letter, please call our office to schedule the follow-up appointment.  If you need a refill on your cardiac medications before your next appointment, please call your pharmacy.

## 2015-06-10 NOTE — Assessment & Plan Note (Signed)
Pretty close to goal last year. Due for follow-up labs from PCP ~ next month. Continue Atorvastatin.

## 2015-06-10 NOTE — Assessment & Plan Note (Addendum)
BP is a bit high today - but he just left the gym.  Usually in the 120 mmHg range. On Toprol & Rampril.

## 2015-06-10 NOTE — Assessment & Plan Note (Signed)
Stable - doing well with no recurrent angina or CHF symptoms.  No bleeding on Effient - no ASA. Continue statin, BB & ACE-I. Continues active exercise regimen.

## 2015-06-12 ENCOUNTER — Other Ambulatory Visit: Payer: Self-pay | Admitting: Cardiology

## 2015-06-12 NOTE — Telephone Encounter (Signed)
REFILL 

## 2015-06-25 ENCOUNTER — Ambulatory Visit (INDEPENDENT_AMBULATORY_CARE_PROVIDER_SITE_OTHER): Payer: BC Managed Care – PPO | Admitting: Family Medicine

## 2015-06-25 ENCOUNTER — Encounter: Payer: Self-pay | Admitting: Family Medicine

## 2015-06-25 VITALS — BP 124/58 | HR 70 | Ht 65.0 in | Wt 141.0 lb

## 2015-06-25 DIAGNOSIS — Z1159 Encounter for screening for other viral diseases: Secondary | ICD-10-CM

## 2015-06-25 DIAGNOSIS — R972 Elevated prostate specific antigen [PSA]: Secondary | ICD-10-CM

## 2015-06-25 DIAGNOSIS — I10 Essential (primary) hypertension: Secondary | ICD-10-CM | POA: Diagnosis not present

## 2015-06-25 DIAGNOSIS — E785 Hyperlipidemia, unspecified: Secondary | ICD-10-CM | POA: Diagnosis not present

## 2015-06-25 DIAGNOSIS — Z125 Encounter for screening for malignant neoplasm of prostate: Secondary | ICD-10-CM | POA: Diagnosis not present

## 2015-06-25 LAB — BASIC METABOLIC PANEL
BUN: 14 mg/dL (ref 7–25)
CHLORIDE: 102 mmol/L (ref 98–110)
CO2: 29 mmol/L (ref 20–31)
Calcium: 9.6 mg/dL (ref 8.6–10.3)
Creat: 0.78 mg/dL (ref 0.70–1.33)
Glucose, Bld: 85 mg/dL (ref 65–99)
Potassium: 4.5 mmol/L (ref 3.5–5.3)
SODIUM: 139 mmol/L (ref 135–146)

## 2015-06-25 LAB — LIPID PANEL
CHOL/HDL RATIO: 2.6 ratio (ref <=5.0)
Cholesterol: 145 mg/dL (ref 125–200)
HDL: 56 mg/dL (ref >=40)
LDL CALC: 69 mg/dL (ref <130)
TRIGLYCERIDES: 101 mg/dL (ref <150)
VLDL: 20 mg/dL (ref <30)

## 2015-06-25 LAB — HEPATITIS C ANTIBODY: HCV Ab: NEGATIVE

## 2015-06-25 NOTE — Progress Notes (Signed)
   Subjective:    Patient ID: [name], male    DOB: [birth year], 60 y.o.   MRN: [id number]  HPI  Here for CPE.  Feels well without complaints.  Just saw his cardiologist  Patient reports no  vision/ hearing changes,anorexia, weight change, fever ,adenopathy, persistant / recurrent hoarseness, swallowing issues, chest pain, edema,persistant / recurrent cough, hemoptysis, dyspnea(rest, exertional, paroxysmal nocturnal), gastrointestinal  bleeding (melena, rectal bleeding), abdominal pain, excessive heart burn, GU symptoms(dysuria, hematuria, pyuria, voiding/incontinence  Issues) syncope, focal weakness, severe memory loss, concerning skin lesions, depression, anxiety, abnormal bruising/bleeding, major joint swelling.    He exercises regularly and is observant of his diet   Review of Systems     Objective:   Physical Exam Neck:  No deformities, thyromegaly, masses, or tenderness noted.   Supple with full range of motion without pain. Ears:  External ear exam shows no significant lesions or deformities.  Otoscopic examination reveals clear canals, tympanic membranes are intact bilaterally without bulging, retraction, inflammation or discharge. Hearing is grossly normal bilaterall Heart - Regular rate and rhythm.  No murmurs, gallops or rubs.    Lungs:  Normal respiratory effort, chest expands symmetrically. Lungs are clear to auscultation, no crackles or wheezes. Abdomen: soft and non-tender without masses, organomegaly or hernias noted.  No guarding or rebound Extremities:  No cyanosis, edema, or deformity noted with good range of motion of all major joints.   Skin:  Intact without suspicious lesions.  Has seborrhea of face         Assessment & Plan:   CPE Normal exam and is utd on screening issues Discussed Prostate cancer screening and he would like to do

## 2015-06-25 NOTE — Patient Instructions (Signed)
Good to see you today!  Thanks for coming in.  I will call you if your tests are not good.  Otherwise I will send you a letter.  If you do not hear from me with in 2 weeks please call our office.     Keep doing what you are doing  Monitor your blood pressure if regularly > 140/90 then come in  Other wise come back in one year

## 2015-06-26 ENCOUNTER — Telehealth: Payer: Self-pay | Admitting: Family Medicine

## 2015-06-26 LAB — PSA: PSA: 7 ng/mL — ABNORMAL HIGH (ref <=4.00)

## 2015-06-26 NOTE — Telephone Encounter (Signed)
Left VM to call me with time and number

## 2015-06-27 ENCOUNTER — Encounter: Payer: Self-pay | Admitting: Family Medicine

## 2015-06-27 ENCOUNTER — Telehealth: Payer: Self-pay | Admitting: Family Medicine

## 2015-06-27 DIAGNOSIS — R972 Elevated prostate specific antigen [PSA]: Secondary | ICD-10-CM | POA: Insufficient documentation

## 2015-06-27 NOTE — Assessment & Plan Note (Signed)
Psa elevated.  Spoke with him via phone and will refer to urology

## 2015-06-27 NOTE — Addendum Note (Signed)
Addended by: Pearlean BrownieHAMBLISS, MARSHALL L on: 06/27/2015 09:32 AM   Modules accepted: Orders

## 2015-06-27 NOTE — Telephone Encounter (Signed)
Pt is calling because he will be at work after 9 am today and would like Dr. Deirdre Priesthambliss to call him at that number [phone]. jw

## 2015-07-02 ENCOUNTER — Telehealth: Payer: Self-pay | Admitting: *Deleted

## 2015-07-02 NOTE — Telephone Encounter (Signed)
[facility]'s [facility].  He was seen with them 08/2014.  She will fax over results for provider to sign off on and have scanned into chart. Johnmatthew Solorio,CMA

## 2015-07-02 NOTE — Telephone Encounter (Signed)
-----   Message from Carney LivingMarshall L Chambliss, MD sent at 06/26/2015 10:01 AM EDT ----- Regarding: [facility]  Pls ask them to send us a copy of his colonoscopy

## 2015-07-15 ENCOUNTER — Telehealth: Payer: Self-pay | Admitting: *Deleted

## 2015-07-15 NOTE — Telephone Encounter (Signed)
I notified pts wife- pt is to be here tomm at 11:30. Page, Felix Pacinicma Barbara [facility].

## 2015-07-15 NOTE — Telephone Encounter (Signed)
Patient wanting to get in with Dr. Deirdre Priesthambliss this week but MD has nothing available. Patient states he is having 'some symptoms related to his high PSA'. Patient would like to know if MD would be willing to work him in sooner than his first available on 6/14.

## 2015-07-15 NOTE — Telephone Encounter (Signed)
Pls let him know I can see him tomorrow in the late morning around 1130 if he can make it then  Thanks  Nedra HaiLee

## 2015-07-16 ENCOUNTER — Ambulatory Visit (INDEPENDENT_AMBULATORY_CARE_PROVIDER_SITE_OTHER): Payer: BC Managed Care – PPO | Admitting: Family Medicine

## 2015-07-16 ENCOUNTER — Encounter: Payer: Self-pay | Admitting: Family Medicine

## 2015-07-16 VITALS — BP 140/74 | HR 78 | Temp 98.5°F | Wt 144.4 lb

## 2015-07-16 DIAGNOSIS — R309 Painful micturition, unspecified: Secondary | ICD-10-CM | POA: Diagnosis not present

## 2015-07-16 LAB — POCT URINALYSIS DIPSTICK
BILIRUBIN UA: NEGATIVE
GLUCOSE UA: NEGATIVE
Ketones, UA: NEGATIVE
Leukocytes, UA: NEGATIVE
Nitrite, UA: NEGATIVE
PROTEIN UA: NEGATIVE
RBC UA: NEGATIVE
Urobilinogen, UA: 0.2
pH, UA: 7

## 2015-07-16 NOTE — Patient Instructions (Signed)
Good to see you today!  Thanks for coming in.  For hydration mix in gatorade with water  I will call ou with the results of the culture  If you have pain or fever or bleeding call me  If culture is negative we will try flomax at night

## 2015-07-16 NOTE — Progress Notes (Signed)
   Subjective:    Patient ID: [name], male    DOB: [birth year], 60 y.o.   MRN: [id number]  HPI  DYSURIA  Pain urinating started several days ago.  Slow onset after I talked with him about elevated PSA Pain is: when urinates Medications tried: drinking lots of water which has improved symptoms Any antibiotics in the last 30 days: no More than 3 UTIs in the last 12 months: no STD exposure: no  Symptoms Urgency: mild Frequency: mild  Gets up 1-2 times at night no big change from baseline Blood in urine: no Pain in back:no Fever: no Mouth Ulcers: no  Review of Symptoms - see HPI PMH - Smoking status noted.      Review of Systems     Objective:   Physical Exam  Alert nad Rectal: slightly enlarged firm prostate without masses or irregularities or tenderness       Assessment & Plan:   Dysuria Not sure of cause.  Normal UA.  Check urine culture.  Complicated because is a male and has elevatd PSA Exam does not support prostatitis.  Maybe LUTS due to BPH.

## 2015-07-18 ENCOUNTER — Encounter: Payer: Self-pay | Admitting: Family Medicine

## 2015-07-18 ENCOUNTER — Telehealth: Payer: Self-pay | Admitting: Family Medicine

## 2015-07-18 LAB — URINE CULTURE
Colony Count: NO GROWTH
Organism ID, Bacteria: NO GROWTH

## 2015-07-18 NOTE — Telephone Encounter (Signed)
Left message will send a Mychart message

## 2015-08-20 ENCOUNTER — Encounter: Payer: Self-pay | Admitting: Family Medicine

## 2016-01-22 ENCOUNTER — Other Ambulatory Visit: Payer: Self-pay

## 2016-01-22 MED ORDER — PRASUGREL HCL 10 MG PO TABS: 10.0000 mg | ORAL_TABLET | Freq: Every day | ORAL | 4 refills | Status: DC

## 2016-01-23 ENCOUNTER — Other Ambulatory Visit: Payer: Self-pay

## 2016-01-23 MED ORDER — PRASUGREL HCL 10 MG PO TABS
10.0000 mg | ORAL_TABLET | Freq: Every day | ORAL | 4 refills | Status: DC
Start: 2016-01-23 — End: 2017-04-23

## 2016-06-01 ENCOUNTER — Other Ambulatory Visit: Payer: Self-pay | Admitting: Cardiology

## 2016-06-01 NOTE — Telephone Encounter (Signed)
REFILL 

## 2016-06-03 ENCOUNTER — Encounter: Payer: Self-pay | Admitting: Cardiology

## 2016-06-03 ENCOUNTER — Ambulatory Visit (INDEPENDENT_AMBULATORY_CARE_PROVIDER_SITE_OTHER): Payer: BC Managed Care – PPO | Admitting: Cardiology

## 2016-06-03 VITALS — BP 130/70 | HR 61 | Ht 65.0 in | Wt 142.6 lb

## 2016-06-03 DIAGNOSIS — E785 Hyperlipidemia, unspecified: Secondary | ICD-10-CM

## 2016-06-03 DIAGNOSIS — I252 Old myocardial infarction: Secondary | ICD-10-CM

## 2016-06-03 DIAGNOSIS — I25119 Atherosclerotic heart disease of native coronary artery with unspecified angina pectoris: Secondary | ICD-10-CM

## 2016-06-03 DIAGNOSIS — I10 Essential (primary) hypertension: Secondary | ICD-10-CM | POA: Diagnosis not present

## 2016-06-03 MED ORDER — NITROGLYCERIN 0.4 MG SL SUBL: 0.4000 mg | SUBLINGUAL_TABLET | SUBLINGUAL | 4 refills | Status: DC | PRN

## 2016-06-03 MED ORDER — ATORVASTATIN CALCIUM 40 MG PO TABS
40.0000 mg | ORAL_TABLET | Freq: Every day | ORAL | 3 refills | Status: DC
Start: 2016-06-03 — End: 2017-08-23

## 2016-06-03 NOTE — Progress Notes (Signed)
PCP: Carney Living, MD  Clinic Note: Chief Complaint  Patient presents with  . 12 month visit    no chest pain , no shortness of breathe , no sweliing    HPI: [name] is a 61 y.o. male with a PMH below who presents today for annual follow-up for CAD-PCI initially in 2007 followed by 2014.  He had non-STEMI in 2007: PCI to the RCA with 2 Cypher DES  May 2014: Abnormal Myoview high risk class II angina. Multivessel CAD. CI RCA and LAD, nonischemic FFR of OM1  [name] was last seen on 06/10/2015. He was doing well at that time - told stories about his trip to [place].  Recent Hospitalizations: None  Studies Reviewed: None  Interval History: [name] returns today in good spirits. He is doing very well with no complaints. Chest pressure rest or exertion since I saw him. He does daily walks and works out maybe 4 or 5 days a week.  He states that he has already had his lab is PCP recently this year, and stayed okay. [name] denies having any of his quite classic anginal pain that he had back in 2014. He is not had any symptoms of PND, orthopnea or edema. He never has had any arrhythmia sensations of rapid irregular heartbeat/palpitations. No syncope/near syncope, TIA or amaurosis fugax. No claudication.[name] He continues to be on statin without any myalgias. No bleeding issues on Effient - he preferred to Plavix.  ROS: A comprehensive was performed. Review of Systems  Constitutional: Negative for malaise/fatigue.  HENT: Negative for congestion and nosebleeds.   Respiratory: Negative for cough, shortness of breath and wheezing.   Cardiovascular:       Per history of present illness  Gastrointestinal: Negative for blood in stool and melena.  Genitourinary: Negative for hematuria.  Musculoskeletal: Negative for falls.  Neurological: Negative for dizziness.  Endo/Heme/Allergies: Positive for environmental allergies (But has not yet started to have congestion and  rhinorrhea.).  Psychiatric/Behavioral: Negative.   All other systems reviewed and are negative.   Past Medical History:  Diagnosis Date  . CAD S/P percutaneous coronary angioplasty 06/29/12   Unstable Angina (High Risk Inferior Ischemia on Myoview) -- mid LAD 70% & 95% after D1; OM1 70%, RCA: prox 60%, mid 95% with ectasia, distal mid 60%, then distal Cypeher stents going into RPAV - patent;; FFR of OM1 ~0.8 (borderline significant)  . HTN (hypertension)   . Hx of non-ST elevation myocardial infarction (NSTEMI) 10/2005   Inf MI - distal RCA 90% - PCI; 60% rPDA; LCA minimal disease; Normal EF without WMA  . Hyperlipidemia LDL goal <70   . Presence of drug coated stent in LAD coronary artery 06/30/12   Promus Premier DES 2.25 mm x 28 mm - dilated to 2.45 mm  . Presence of drug coated stent in right coronary artery Sept 2007   Cypher DES 3.0 mm x 13, 3.0 mm x 18 mm overlapping;  . Presence of drug coated stent in right coronary artery 06/30/12   2 overlapping Promus Premier DES 3.0 mm x 24 mm -- 3.5 mm x 18 mm (dilated prox to distal to: 3.88 -- 4.1, 4.25 - 3.68)    Past Surgical History:  Procedure Laterality Date  . CARDIAC CATHETERIZATION  06/28/2012  . CATARACT EXTRACTION W/ INTRAOCULAR LENS  IMPLANT, BILATERAL Bilateral 1991-  1992  . CORONARY ANGIOPLASTY WITH STENT PLACEMENT  10/2005   "2";  Distal RCA: Cypher DES 3.0 mm x  13mm, 3.0 mm x 18 mm overlapping; (06/28/2012)  . CORONARY ANGIOPLASTY WITH STENT PLACEMENT  06/30/12   2 DES to proximal-mid RCA, 1 DES to LAD (see med history for details)  . CORONARY ANGIOPLASTY WITH STENT PLACEMENT  06/29/2012   2X STENTS  . LEFT HEART CATHETERIZATION WITH CORONARY ANGIOGRAM N/A 06/28/2012   Procedure: LEFT HEART CATHETERIZATION WITH CORONARY ANGIOGRAM;  Surgeon: Marykay Lex, MD;  Location: [facility] CATH LAB;  Service: Cardiovascular;  Laterality: N/A;  . PERCUTANEOUS CORONARY STENT INTERVENTION (PCI-S) N/A 06/29/2012   Procedure: PERCUTANEOUS CORONARY STENT  INTERVENTION (PCI-S);  Surgeon: Marykay Lex, MD;  Location: [facility] CATH LAB;  Service: Cardiovascular;  Laterality: N/A;    Current Meds  Medication Sig  . atorvastatin (LIPITOR) 40 MG tablet Take 1 tablet (40 mg total) by mouth daily.  . metoprolol succinate (TOPROL-XL) 25 MG 24 hr tablet TAKE 1 TABLET (25 MG TOTAL) BY MOUTH DAILY.  . nitroGLYCERIN (NITROSTAT) 0.4 MG SL tablet Place 1 tablet (0.4 mg total) under the tongue every 5 (five) minutes as needed for chest pain.  . prasugrel (EFFIENT) 10 MG TABS tablet Take 1 tablet (10 mg total) by mouth daily.  . ramipril (ALTACE) 10 MG capsule TAKE 1 CAPSULE (10 MG TOTAL) BY MOUTH DAILY.  . [DISCONTINUED] atorvastatin (LIPITOR) 40 MG tablet TAKE 1 TABLET (40 MG TOTAL) BY MOUTH DAILY.  . [DISCONTINUED] nitroGLYCERIN (NITROSTAT) 0.4 MG SL tablet Place 1 tablet (0.4 mg total) under the tongue every 5 (five) minutes as needed for chest pain.    Allergies  Allergen Reactions  . Simvastatin Other (See Comments)    Dizzy, spaced out    Social History   Social History  . Marital status: Married    Spouse name: N/A  . Number of children: 0  . Years of education: N/A   Occupational History  . works at [employer]   Social History Main Topics  . Smoking status: Former Smoker    Packs/day: 1.00    Years: 15.00    Types: Cigarettes    Quit date: 02/23/1992  . Smokeless tobacco: Never Used  . Alcohol use 3.6 oz/week    3 Glasses of wine, 3 Cans of beer per week     Comment: 06/28/2012 "couple glass of wine or beer maybe 3X/wk"  . Drug use: No  . Sexual activity: Yes   Other Topics Concern  . None   Social History Narrative  . None    family history includes Coronary artery disease (age of onset: 63) in his brother; Heart attack (age of onset: 48) in his father.  Wt Readings from Last 3 Encounters:  06/03/16 142 lb 9.6 oz (64.7 kg)  07/16/15 144 lb 6.4 oz (65.5 kg)  06/25/15 141 lb (64 kg)    PHYSICAL EXAM BP 130/70 (BP  Location: Left Arm, Patient Position: Sitting, Cuff Size: Normal)   Pulse 61   Ht  (1.651 m)   Wt 142 lb 9.6 oz (64.7 kg)   BMI 23.73 kg/m  General appearance: alert, cooperative, appears stated age, no distress and Healthy-appearing. Well-nourished and well-groomed. Normal mood and affect. HEENT: [place]/AT, EOMI, MMM, anicteric sclera Neck: no adenopathy, no carotid bruit and no JVD Lungs: CTA B, normal percussion bilaterally and non-labored Heart: RRR, normal S1 & S2; no murmur, click, rub or gallop., Nondisplaced PMI Abdomen: soft, non-tender; bowel sounds normal; no masses,  no organomegaly; no HJR Extremities: extremities normal, atraumatic, no cyanosis, and edema  Pulses: 2+ and  symmetric;  Skin: mobility and turgor normal, no edema, no evidence of bleeding or bruising and no lesions noted or  Neurologic: Mental status: Alert, oriented, thought content appropriate Cranial nerves: normal (II-XII grossly intact)   Adult ECG Report  Rate: 61 ;  Rhythm: normal sinus rhythm and Normal axis, intervals and durations.;   Narrative Interpretation: stable EKG  Other studies Reviewed: Additional studies/ records that were reviewed today include:  Recent Labs: checked by PCP recently.  ASSESSMENT / PLAN: Problem List Items Addressed This Visit    CAD (coronary artery disease), native coronary artery, now with severe 90-95% mid RCA and mid LAD lesions 70-80% for PCI; FFR of OM1 ~70% lesion = 0.8 - Primary (Chronic)    Stable now after his last PCI. No further symptoms. Doing well on therapy with aspirin. No bleeding issues. He is on stable low-dose Toprol, along with moderate dose ramipril and atorvastatin.  Continues to remain active with routine exercise.      Relevant Medications   atorvastatin (LIPITOR) 40 MG tablet   nitroGLYCERIN (NITROSTAT) 0.4 MG SL tablet   Other Relevant Orders   EKG 12-Lead   Essential hypertension (Chronic)    Well-controlled blood pressure on beta  blocker and ACE inhibitor      Relevant Medications   atorvastatin (LIPITOR) 40 MG tablet   nitroGLYCERIN (NITROSTAT) 0.4 MG SL tablet   Other Relevant Orders   EKG 12-Lead   Hx of non-ST elevation myocardial infarction (NSTEMI) (Chronic)    Initially had an inferior MI with PCI to RCA and he subsequently had PCI to the LAD and RCA. Existing disease in OM 1 was evaluated with FFR. Continue treating medically. No recurrent symptoms. Preserved EF with no heart failure.       Relevant Orders   EKG 12-Lead   Hyperlipidemia with target LDL less than 70 (Chronic)    Switched over to atorvastatin 2014 labs monitored by PCP. According to patient is an echo. I do not have recent labs.      Relevant Medications   atorvastatin (LIPITOR) 40 MG tablet   nitroGLYCERIN (NITROSTAT) 0.4 MG SL tablet      Current medicines are reviewed at length with the patient today. (+/- concerns) none The following changes have been made: none  Patient Instructions  NO CHANGE IN TREATMENT AND MEDICATIONS    Your physician wants you to follow-up in 12 MONTHS WITH DR Selma Rodelo.You will receive a reminder letter in the mail two months in advance. If you don't receive a letter, please call our office to schedule the follow-up appointment.    If you need a refill on your cardiac medications before your next appointment, please call your pharmacy.     Studies Ordered:   Orders Placed This Encounter  Procedures  . EKG 12-Lead      Bryan Lemma, M.D., M.S. Interventional Cardiologist   Pager # [phone] Phone # [phone] [address] [place], [place] [address]

## 2016-06-03 NOTE — Patient Instructions (Signed)
NO CHANGE IN TREATMENT AND MEDICATIONS    Your physician wants you to follow-up in 12 MONTHS WITH DR HARDING.You will receive a reminder letter in the mail two months in advance. If you don't receive a letter, please call our office to schedule the follow-up appointment.    If you need a refill on your cardiac medications before your next appointment, please call your pharmacy.

## 2016-06-04 ENCOUNTER — Encounter: Payer: Self-pay | Admitting: Cardiology

## 2016-06-04 NOTE — Assessment & Plan Note (Signed)
Stable now after his last PCI. No further symptoms. Doing well on therapy with aspirin. No bleeding issues. He is on stable low-dose Toprol, along with moderate dose ramipril and atorvastatin.  Continues to remain active with routine exercise.

## 2016-06-04 NOTE — Assessment & Plan Note (Signed)
Well-controlled blood pressure on beta blocker and ACE inhibitor

## 2016-06-04 NOTE — Assessment & Plan Note (Signed)
Switched over to atorvastatin 2014 labs monitored by PCP. According to patient is an echo. I do not have recent labs.

## 2016-06-04 NOTE — Assessment & Plan Note (Signed)
Initially had an inferior MI with PCI to RCA and he subsequently had PCI to the LAD and RCA. Existing disease in OM 1 was evaluated with FFR. Continue treating medically. No recurrent symptoms. Preserved EF with no heart failure.

## 2016-08-29 ENCOUNTER — Other Ambulatory Visit: Payer: Self-pay | Admitting: Cardiology

## 2017-04-23 ENCOUNTER — Other Ambulatory Visit: Payer: Self-pay | Admitting: Cardiology

## 2017-04-26 NOTE — Telephone Encounter (Signed)
Rx(s) sent to pharmacy electronically.  

## 2017-05-19 ENCOUNTER — Telehealth: Payer: Self-pay | Admitting: Family Medicine

## 2017-05-19 NOTE — Telephone Encounter (Signed)
Called Pt; left voicemail  °

## 2017-06-05 ENCOUNTER — Encounter: Payer: Self-pay | Admitting: Cardiology

## 2017-06-05 NOTE — Progress Notes (Signed)
PCP: Carney Living, MD  Clinic Note: Chief Complaint  Patient presents with  . Follow-up    No symptoms  . Coronary Artery Disease    HPI: [name] is a 63 y.o. male with a PMH below who presents today for annual follow-up for CAD and PCI in 2007 and then again in 2014.  September 2007 -non-STEMI: PCI to the RCA with 2 Cypher DES   May 2014: Abnormal Myoview high risk class II angina. Multivessel CAD. PCI to RCA and LAD, nonischemic FFR of OM1  TERON BLAIS was last seen on June 03, 2016 -doing quite well.  Doing workouts about 4-5 days a week.  No more angina after PCI.  Doing well on Effient.  Recent Hospitalizations: None  Studies Personally Reviewed - (if available, images/films reviewed: From Epic Chart or Care Everywhere)  None  Interval History: [name] returns today overall doing quite well.  He actually just got through working out at the gym today.  He still is doing about 4-5 days a week doing his workouts.  He is not having any cardiac symptoms of angina or dyspnea with rest or exertion.  He is due for labs to be checked by his PCP soon.  He is pretty much asymptomatic from a cardiac standpoint. Cardiovascular Review Of Symptoms: no chest pain or dyspnea on exertion negative for - edema, irregular heartbeat, murmur, orthopnea, palpitations, paroxysmal nocturnal dyspnea, rapid heart rate, shortness of breath or TIA/amaurosis fugax, syncope or near syncope.  Melena, hematochezia, hematuria, epistaxis   ROS: A comprehensive was performed. Review of Systems  Constitutional: Negative for malaise/fatigue.  HENT: Negative for congestion (Off and on with allergies only.).        Rhinorrhea/allergies   Respiratory: Negative for cough and shortness of breath.   Musculoskeletal: Negative for falls, joint pain and myalgias.  Neurological: Negative for dizziness, weakness and headaches.  Endo/Heme/Allergies: Positive for environmental allergies.    Psychiatric/Behavioral: Negative.   All other systems reviewed and are negative. -   I have reviewed and (if needed) personally updated the patient's problem list, medications, allergies, past medical and surgical history, social and family history.   Past Medical History:  Diagnosis Date  . CAD S/P percutaneous coronary angioplasty 10/2005, 06/2012   a) Inf NSTEMI - distal RCA 90% - PCI; 60% rPDA -- PCI dRCA-PDA: 2 overlapping Cypher DES 3.0 x 13, 3.0 x 18;; b) [place] Angina - Abn Myoview with Inf Ischemia --> mLAD 70%-95% (after D1) - PCI w/ Promus P. DES 2.25 x 28 (2.45 mm); p-mRCA 60%, 95%-60%--> PCI w/ 2 overlapping Promus P DES 3.0 x 24 & 3.5 x 18 (dilated p-d: 3.88 - 4.1 - 4.23 - 3.68); OM1 - FFR 0.80 ( Med Rx)  . HTN (hypertension)   . Hx of non-ST elevation myocardial infarction (NSTEMI) 10/2005   Inf MI - distal RCA 90% - PCI; 60% rPDA; LCA minimal disease; Normal EF without WMA  . Hyperlipidemia LDL goal <70     Past Surgical History:  Procedure Laterality Date  . CATARACT EXTRACTION W/ INTRAOCULAR LENS  IMPLANT, BILATERAL Bilateral 1991-  1992  . LEFT HEART CATHETERIZATION WITH CORONARY ANGIOGRAM N/A 06/28/2012   Procedure: LEFT HEART CATHETERIZATION WITH CORONARY ANGIOGRAM;  Surgeon: Marykay Lex, MD;  Location: [facility] CATH LAB;  Service: Cardiovascular::  mid LAD 70% & 95% after D1; OM1 70%, RCA: prox 60%, mid 95% with ectasia, distal mid 60%, then distal Cypeher stents going into RPAV - patent;;  FFR of OM1 ~0.8 (borderline significant)  . PERCUTANEOUS CORONARY STENT INTERVENTION (PCI-S) N/A 06/29/2012   Procedure: PERCUTANEOUS CORONARY STENT INTERVENTION (PCI-S);  Surgeon: Marykay Lex, MD;  Location: [facility] CATH LAB;  Service: Cardiovascular:: RCA - 2 overlapping Promus Premier DES 3.0 mm x 24 mm -- 3.5 mm x 18 mm (dilated prox to distal to: 3.88 -- 4.1, 4.25 - 3.68); LAD - Promus Premier DES 2.25 mm x 28 mm - dilated to 2.45 mm  . PERCUTANEOUS CORONARY STENT INTERVENTION (PCI-S)   10/2005   "2";  Distal RCA: Cypher DES 3.0 mm x 13mm, 3.0 mm x 18 mm overlapping; (06/28/2012)    Current Meds  Medication Sig  . atorvastatin (LIPITOR) 40 MG tablet Take 1 tablet (40 mg total) by mouth daily.  . metoprolol succinate (TOPROL-XL) 25 MG 24 hr tablet TAKE 1 TABLET (25 MG TOTAL) BY MOUTH DAILY.  . nitroGLYCERIN (NITROSTAT) 0.4 MG SL tablet Place 1 tablet (0.4 mg total) under the tongue every 5 (five) minutes as needed for chest pain.  . ramipril (ALTACE) 10 MG capsule TAKE 1 CAPSULE (10 MG TOTAL) BY MOUTH DAILY.  . [DISCONTINUED] prasugrel (EFFIENT) 10 MG TABS tablet TAKE 1 TABLET (10 MG TOTAL) BY MOUTH DAILY.    Allergies  Allergen Reactions  . Simvastatin Other (See Comments)    Dizzy, spaced out    Social History   Tobacco Use  . Smoking status: Former Smoker    Packs/day: 1.00    Years: 15.00    Pack years: 15.00    Types: Cigarettes    Last attempt to quit: 02/23/1992    Years since quitting: 25.3  . Smokeless tobacco: Never Used  Substance Use Topics  . Alcohol use: Yes    Alcohol/week: 3.6 oz    Types: 3 Glasses of wine, 3 Cans of beer per week    Comment: 06/28/2012 "couple glass of wine or beer maybe 3X/wk"  . Drug use: No     Social History   Social History Narrative  . Not on file    family history includes Coronary artery disease (age of onset: 51) in his brother; Heart attack (age of onset: 46) in his father.  Wt Readings from Last 3 Encounters:  06/06/17 146 lb (66.2 kg)  06/03/16 142 lb 9.6 oz (64.7 kg)  07/16/15 144 lb 6.4 oz (65.5 kg)    PHYSICAL EXAM BP 116/70   Pulse 78   Ht 5\' 5"  (1.651 m)   Wt 146 lb (66.2 kg)   BMI 24.30 kg/m  Physical Exam  Constitutional: He is oriented to person, place, and time. He appears well-developed and well-nourished. No distress.  HENT:  Head: Normocephalic and atraumatic.  Neck: No hepatojugular reflux and no JVD present. Carotid bruit is not present.  Cardiovascular: Normal rate, regular  rhythm, normal heart sounds and intact distal pulses.  No extrasystoles are present. PMI is not displaced. Exam reveals no gallop and no friction rub.  No murmur heard. Pulmonary/Chest: Effort normal and breath sounds normal. No respiratory distress. He has no wheezes. He has no rales.  Abdominal: Soft. Bowel sounds are normal. He exhibits no distension. There is no tenderness. There is no rebound.  Musculoskeletal: Normal range of motion. He exhibits no edema.  Neurological: He is alert and oriented to person, place, and time.  Psychiatric: He has a normal mood and affect. His behavior is normal. Judgment and thought content normal.  Nursing note and vitals reviewed.     Adult  ECG Report  Rate: 78;  Rhythm: normal sinus rhythm; Normal axis, intervals and durations  Narrative Interpretation:  normal EKG.  Stable   Other studies Reviewed: Additional studies/ records that were reviewed today include:  Recent Labs:  Followed by PCP - not available - due this month   ASSESSMENT / PLAN: Problem List Items Addressed This Visit    Hyperlipidemia with target LDL less than 70 (Chronic)    Now with stable dose of atorvastatin.  Labs are being followed by PCP.  Previous check stable relatively well controlled.  The last available labs I have are from May 2017, LDL was 69.      Relevant Medications   aspirin EC 81 MG tablet   rivaroxaban (XARELTO) 2.5 MG TABS tablet   Hx of non-ST elevation myocardial infarction (NSTEMI) (Chronic)    He had a non-ST elevation MI that was inferior with PCI to the LAD and a subsequent ACS had PCI to the LAD as well as RCA.    preserved EF by echo with no heart failure or angina symptoms.      Essential hypertension (Chronic)    Well-controlled blood pressure on low-dose beta-blocker and moderate dose ACE inhibitor.      Relevant Medications   aspirin EC 81 MG tablet   rivaroxaban (XARELTO) 2.5 MG TABS tablet   CAD S/P RCA & LAD PCI with mod-severe OM1  lesion ( FFR 0.8) - Primary (Chronic)    PCI to both the LAD and RCA He does have any resting OM1 lesion that was borderline significant by FFR, we are continuing to treat medically with no residual symptoms.  Remains very active. On stable dose of beta-blocker and statin.  Plan: Continue Effient to complete his current 5677-month prescription, then will convert to a baby aspirin 81 mg and 2.5 mg Xarelto twice daily.        Relevant Medications   aspirin EC 81 MG tablet   rivaroxaban (XARELTO) 2.5 MG TABS tablet   Other Relevant Orders   EKG 12-Lead (Completed)       I spent a total of 25 minutes with the patient and chart review. >  50% of the time was spent in direct patient consultation.   Current medicines are reviewed at length with the patient today.  (+/- concerns) n/a The following changes have been made:  n/a  Patient Instructions  MEDICATION INSTRUCTION  After this current 3 month  Prescription of Effient  Stop and  change to Xarelto 2.5 mg  Twice a day and Aspirin 81mg     Please have primary to send labs -- fax [phone]    Your physician wants you to follow-up in 12 month with DR Apostolos Blagg.You will receive a reminder letter in the mail two months in advance. If you don't receive a letter, please call our office to schedule the follow-up appointment.   If you need a refill on your cardiac medications before your next appointment, please call your pharmacy.     Studies Ordered:   Orders Placed This Encounter  Procedures  . EKG 12-Lead      Bryan Lemmaavid Cheyna Retana, M.D., M.S. Interventional Cardiologist   Pager # [phone] Phone # [phone] [address] [place], [place] [address]   Thank you for choosing [facility]!!

## 2017-06-06 ENCOUNTER — Ambulatory Visit: Payer: BC Managed Care – PPO | Admitting: Cardiology

## 2017-06-06 ENCOUNTER — Encounter: Payer: Self-pay | Admitting: Cardiology

## 2017-06-06 VITALS — BP 116/70 | HR 78 | Ht 65.0 in | Wt 146.0 lb

## 2017-06-06 DIAGNOSIS — Z9861 Coronary angioplasty status: Secondary | ICD-10-CM | POA: Diagnosis not present

## 2017-06-06 DIAGNOSIS — I2510 Atherosclerotic heart disease of native coronary artery without angina pectoris: Secondary | ICD-10-CM

## 2017-06-06 DIAGNOSIS — I10 Essential (primary) hypertension: Secondary | ICD-10-CM | POA: Diagnosis not present

## 2017-06-06 DIAGNOSIS — I252 Old myocardial infarction: Secondary | ICD-10-CM | POA: Diagnosis not present

## 2017-06-06 DIAGNOSIS — E785 Hyperlipidemia, unspecified: Secondary | ICD-10-CM

## 2017-06-06 MED ORDER — RIVAROXABAN 2.5 MG PO TABS: 2.5000 mg | ORAL_TABLET | Freq: Two times a day (BID) | ORAL | 0 refills | Status: DC

## 2017-06-06 MED ORDER — RIVAROXABAN 2.5 MG PO TABS: 2.5000 mg | ORAL_TABLET | Freq: Two times a day (BID) | ORAL | 3 refills | Status: DC

## 2017-06-06 MED ORDER — ASPIRIN EC 81 MG PO TBEC: 81.0000 mg | DELAYED_RELEASE_TABLET | Freq: Every day | ORAL | 3 refills | Status: AC

## 2017-06-06 NOTE — Patient Instructions (Signed)
MEDICATION INSTRUCTION  After this current 3 month  Prescription of Effient  Stop and  change to Xarelto 2.5 mg  Twice a day and Aspirin 81mg     Please have primary to send labs -- fax [phone]    Your physician wants you to follow-up in 12 month with DR HARDING.You will receive a reminder letter in the mail two months in advance. If you don't receive a letter, please call our office to schedule the follow-up appointment.   If you need a refill on your cardiac medications before your next appointment, please call your pharmacy.

## 2017-06-07 ENCOUNTER — Encounter: Payer: Self-pay | Admitting: Cardiology

## 2017-06-07 NOTE — Assessment & Plan Note (Signed)
He had a non-ST elevation MI that was inferior with PCI to the LAD and a subsequent ACS had PCI to the LAD as well as RCA.    preserved EF by echo with no heart failure or angina symptoms.

## 2017-06-07 NOTE — Assessment & Plan Note (Signed)
Well-controlled blood pressure on low-dose beta-blocker and moderate dose ACE inhibitor.

## 2017-06-07 NOTE — Assessment & Plan Note (Addendum)
PCI to both the LAD and RCA He does have any resting OM1 lesion that was borderline significant by FFR, we are continuing to treat medically with no residual symptoms.  Remains very active. On stable dose of beta-blocker and statin.  Plan: Continue Effient to complete his current 6085-month prescription, then will convert to a baby aspirin 81 mg and 2.5 mg Xarelto twice daily.

## 2017-06-07 NOTE — Assessment & Plan Note (Signed)
Now with stable dose of atorvastatin.  Labs are being followed by PCP.  Previous check stable relatively well controlled.  The last available labs I have are from May 2017, LDL was 69.

## 2017-07-23 ENCOUNTER — Other Ambulatory Visit: Payer: Self-pay | Admitting: Cardiology

## 2017-08-06 ENCOUNTER — Other Ambulatory Visit: Payer: Self-pay | Admitting: Cardiology

## 2017-08-18 ENCOUNTER — Other Ambulatory Visit: Payer: Self-pay | Admitting: Cardiology

## 2017-08-23 ENCOUNTER — Other Ambulatory Visit: Payer: Self-pay | Admitting: Cardiology

## 2018-01-17 ENCOUNTER — Telehealth: Payer: Self-pay | Admitting: Family Medicine

## 2018-01-17 NOTE — Telephone Encounter (Signed)
Attempted to contact pt to schedule appt with PCP for overdue health maintenance checks and discuss overdue COL; no answer. -CH

## 2018-05-25 ENCOUNTER — Telehealth: Payer: Self-pay | Admitting: Cardiology

## 2018-05-25 NOTE — Telephone Encounter (Signed)
Will route to nurse overseeing virtual visits.

## 2018-05-25 NOTE — Telephone Encounter (Signed)
LEFT MESSAGE  FOR PATIENT TO CALL BACK - TO DISCUSS SWITCHING TO  VIRTUAL VISIT ON 06/06/18 WITH DR HARDING.. INFORMATION SENT TO MYCHART FOR PATIENT TO REVIEW PRIOR TO CONVERSATION.

## 2018-05-25 NOTE — Telephone Encounter (Signed)
New Message   Pt is calling about his upcoming virtual appointment  Please call

## 2018-05-29 ENCOUNTER — Other Ambulatory Visit: Payer: Self-pay

## 2018-06-01 NOTE — Telephone Encounter (Signed)
PATIENT AWARE

## 2018-06-02 ENCOUNTER — Telehealth: Payer: Self-pay | Admitting: Cardiology

## 2018-06-02 NOTE — Telephone Encounter (Addendum)
Home phone/ my chart/pt consented to virtual visit/ pre reg completed

## 2018-06-06 ENCOUNTER — Ambulatory Visit: Payer: BC Managed Care – PPO | Admitting: Cardiology

## 2018-06-06 ENCOUNTER — Telehealth (INDEPENDENT_AMBULATORY_CARE_PROVIDER_SITE_OTHER): Payer: BC Managed Care – PPO | Admitting: Cardiology

## 2018-06-06 ENCOUNTER — Encounter: Payer: Self-pay | Admitting: Cardiology

## 2018-06-06 VITALS — BP 121/70 | HR 82 | Ht 65.0 in | Wt 140.0 lb

## 2018-06-06 DIAGNOSIS — I2510 Atherosclerotic heart disease of native coronary artery without angina pectoris: Secondary | ICD-10-CM | POA: Diagnosis not present

## 2018-06-06 DIAGNOSIS — Z79899 Other long term (current) drug therapy: Secondary | ICD-10-CM

## 2018-06-06 DIAGNOSIS — E785 Hyperlipidemia, unspecified: Secondary | ICD-10-CM

## 2018-06-06 DIAGNOSIS — Z9861 Coronary angioplasty status: Secondary | ICD-10-CM

## 2018-06-06 DIAGNOSIS — I10 Essential (primary) hypertension: Secondary | ICD-10-CM

## 2018-06-06 DIAGNOSIS — I252 Old myocardial infarction: Secondary | ICD-10-CM

## 2018-06-06 NOTE — Patient Instructions (Signed)
Medication Instructions:  NO CHANGE If you need a refill on your cardiac medications before your next appointment, please call your pharmacy.   Lab work: Your physician recommends that you return for lab work in: 4-6 WEEKS PRIOR TO EATING If you have labs (blood work) drawn today and your tests are completely normal, you will receive your results only by: [name] MyChart Message (if you have MyChart) OR . A paper copy in the mail If you have any lab test that is abnormal or we need to change your treatment, we will call you to review the results.  Follow-Up: At [facility], you and your health needs are our priority.  As part of our continuing mission to provide you with exceptional heart care, we have created designated Provider Care Teams.  These Care Teams include your primary Cardiologist (physician) and Advanced Practice Providers (APPs -  Physician Assistants and Nurse Practitioners) who all work together to provide you with the care you need, when you need it. You will need a follow up appointment in 12 months.  Please call our office 2 months in advance to schedule this appointment.  You may see Bryan Lemma, MD or one of the following Advanced Practice Providers on your designated Care Team:   Theodore Demark, PA-C . Joni Reining, DNP, ANP

## 2018-06-06 NOTE — Assessment & Plan Note (Signed)
Diabetes still doing quite well now just about 6 years out from PCI to the LAD and RCA.  Borderline FFR on the OM1 is likely not physiologically significant as he is doing well without any active angina. Is on stable dose of beta-blocker, ACE inhibitor and statin. Converted from Effient to low-dose Xarelto and aspirin.  Doing well with less bruising

## 2018-06-06 NOTE — Progress Notes (Signed)
Virtual Visit via Video Note   This visit type was conducted due to national recommendations for restrictions regarding the COVID-19 Pandemic (e.g. social distancing) in an effort to limit this patient's exposure and mitigate transmission in our community.  Due to his co-morbid illnesses, this patient is at least at moderate risk for complications without adequate follow up.  This format is felt to be most appropriate for this patient at this time.  All issues noted in this document were discussed and addressed.  A limited physical exam was performed with this format.  Please refer to the patient's chart for his consent to telehealth [facility].   Patient has given verbal permission to conduct this visit via virtual appointment and to bill insurance 05/25/2018 4:24 PM      Evaluation Performed:  Follow-up visit  Date:  06/06/2018   ID:  [name], DOB 25-Oct-1955, MRN [id number]  Patient Location: Home  Provider Location: Home  PCP:  Carney Livinghambliss, Marshall L, MD  Cardiologist:  Bryan Lemmaavid Arloa Prak, MD  Electrophysiologist:  None   Chief Complaint:  Annual f/u - CAD-PCI.  History of Present Illness:    [name] is a 63 y.o. male who presents via audio/video conferencing for a telehealth visit today for annual f/u.   CV History:   September 2007 -non-STEMI: PCI to the RCA with 2 Cypher DES   May 2014: Abnormal Myoview high risk class II angina. Multivessel CAD. PCI to RCA and LAD, nonischemic FFR of OM1  [name] was last seen on June 06, 2017 -doing quite well.  Doing workouts about 4-5 days a week.  No more angina after PCI.  We switched from Effient to low dose Xarelto & ASA.  Interval History: [name] is seen you this morning having just returned from his 4 mile walk.  This is been much a daily exercise for him.  He is a little bit upset because the rec center is been closed and is not delicate his routine workout that he used to do.  But otherwise he is try  to stay as active as possible being at home.  He states that he is wife only going out to go to the grocery store maybe once a week.  As usual, he is doing fine from a cardiac standpoint.  He does note less bruising having switched to Xarelto/aspirin from [place].  No bleeding issues.  Overall stable from a cardiovascular standpoint.  Cardiovascular ROS: no chest pain or dyspnea on exertion negative for - edema, irregular heartbeat, orthopnea, palpitations, paroxysmal nocturnal dyspnea, rapid heart rate, shortness of breath or syncope / near syncope. TIA/amaurosis fugax.  Melena, hematochezia, hematuria, epistaxis  Review of Systems  Constitutional: Negative for malaise/fatigue.  HENT: Negative for congestion and hearing loss.   Respiratory: Negative for cough, shortness of breath and wheezing.   Gastrointestinal: Negative for abdominal pain, constipation and heartburn.  Genitourinary: Negative for dysuria.  Musculoskeletal: Negative for falls and joint pain.  Neurological: Negative for dizziness and focal weakness.  Psychiatric/Behavioral: Negative for depression and memory loss. The patient is not nervous/anxious and does not have insomnia.        Lots of stress with COVID-19 restrictions -- BP a bit reactive   All other systems reviewed and are negative.   The patient does not have symptoms concerning for COVID-19 infection (fever, chills, cough, or new shortness of breath).    Past Medical History:  Diagnosis Date   CAD S/P percutaneous coronary angioplasty  10/2005, 06/2012   a) Inf NSTEMI - distal RCA 90% - PCI; 60% rPDA -- PCI dRCA-PDA: 2 overlapping Cypher DES 3.0 x 13, 3.0 x 18;; b) Crescendo Angina - Abn Myoview with Inf Ischemia --> mLAD 70%-95% (after D1) - PCI w/ Promus P. DES 2.25 x 28 (2.45 mm); p-mRCA 60%, 95%-60%--> PCI w/ 2 overlapping Promus P DES 3.0 x 24 & 3.5 x 18 (dilated p-d: 3.88 - 4.1 - 4.23 - 3.68); OM1 - FFR 0.80 ( Med Rx)   HTN (hypertension)    Hx of  non-ST elevation myocardial infarction (NSTEMI) 10/2005   Inf MI - distal RCA 90% - PCI; 60% rPDA; LCA minimal disease; Normal EF without WMA   Hyperlipidemia LDL goal <70    Past Surgical History:  Procedure Laterality Date   CATARACT EXTRACTION W/ INTRAOCULAR LENS  IMPLANT, BILATERAL Bilateral 1991-  1992   LEFT HEART CATHETERIZATION WITH CORONARY ANGIOGRAM N/A 06/28/2012   Procedure: LEFT HEART CATHETERIZATION WITH CORONARY ANGIOGRAM;  Surgeon: Marykay Lex, MD;  Location: [facility] CATH LAB;  Service: Cardiovascular::  mid LAD 70% & 95% after D1; OM1 70%, RCA: prox 60%, mid 95% with ectasia, distal mid 60%, then distal Cypeher stents going into RPAV - patent;; FFR of OM1 ~0.8 (borderline significant)   PERCUTANEOUS CORONARY STENT INTERVENTION (PCI-S) N/A 06/29/2012   Procedure: PERCUTANEOUS CORONARY STENT INTERVENTION (PCI-S);  Surgeon: Marykay Lex, MD;  Location: [facility] CATH LAB;  Service: Cardiovascular:: RCA - 2 overlapping Promus Premier DES 3.0 mm x 24 mm -- 3.5 mm x 18 mm (dilated prox to distal to: 3.88 -- 4.1, 4.25 - 3.68); LAD - Promus Premier DES 2.25 mm x 28 mm - dilated to 2.45 mm   PERCUTANEOUS CORONARY STENT INTERVENTION (PCI-S)  10/2005   "2";  Distal RCA: Cypher DES 3.0 mm x 43mm, 3.0 mm x 18 mm overlapping; (06/28/2012)     Current Meds  Medication Sig   aspirin EC 81 MG tablet Take 1 tablet (81 mg total) by mouth daily.   atorvastatin (LIPITOR) 40 MG tablet TAKE 1 TABLET (40 MG TOTAL) BY MOUTH DAILY.   metoprolol succinate (TOPROL-XL) 25 MG 24 hr tablet TAKE 1 TABLET (25 MG TOTAL) BY MOUTH DAILY.   nitroGLYCERIN (NITROSTAT) 0.4 MG SL tablet Place 1 tablet (0.4 mg total) under the tongue every 5 (five) minutes as needed for chest pain.   ramipril (ALTACE) 10 MG capsule TAKE 1 CAPSULE (10 MG TOTAL) BY MOUTH DAILY.   rivaroxaban (XARELTO) 2.5 MG TABS tablet Take 1 tablet (2.5 mg total) by mouth 2 (two) times daily.     Allergies:   Simvastatin   Social History    Tobacco Use   Smoking status: Former Smoker    Packs/day: 1.00    Years: 15.00    Pack years: 15.00    Types: Cigarettes    Last attempt to quit: 02/23/1992    Years since quitting: 26.3   Smokeless tobacco: Never Used  Substance Use Topics   Alcohol use: Yes    Alcohol/week: 6.0 standard drinks    Types: 3 Glasses of wine, 3 Cans of beer per week    Comment: 06/28/2012 "couple glass of wine or beer maybe 3X/wk"   Drug use: No     Family Hx: The patient's family history includes Coronary artery disease (age of onset: 64) in his brother; Heart attack (age of onset: 60) in his father.   Prior CV studies:   The following studies were reviewed today: --  No new studies  Labs/Other Tests and Data Reviewed:    EKG:  No ECG reviewed.  Recent Labs: No results found for requested labs within last 8760 hours.   Recent Lipid Panel -- overdue for labs (due to COVID-19)  Wt Readings from Last 3 Encounters:  06/06/18 140 lb (63.5 kg)  06/06/17 146 lb (66.2 kg)  06/03/16 142 lb 9.6 oz (64.7 kg)     Objective:    Vital Signs:  BP 121/70    Pulse 82    Ht  (1.651 m)    Wt 140 lb (63.5 kg)    BMI 23.30 kg/m   Video Visual Exam.  Well nourished, well developed male in no acute distress. A&O x 3.  Normal mood and affect Nonlabored breathing. No JVD  ASSESSMENT & PLAN:    Problem List Items Addressed This Visit    CAD S/P RCA & LAD PCI with mod-severe OM1 lesion ( FFR 0.8) (Chronic)    Diabetes still doing quite well now just about 6 years out from PCI to the LAD and RCA.  Borderline FFR on the OM1 is likely not physiologically significant as he is doing well without any active angina. Is on stable dose of beta-blocker, ACE inhibitor and statin. Converted from Effient to low-dose Xarelto and aspirin.  Doing well with less bruising      Relevant Orders   Comprehensive Metabolic Panel (CMET)   CBC   Lipid panel   Essential hypertension (Chronic)    Blood pressure is  usually well controlled on his beta-blocker and ACE inhibitor. Continue to monitor but no change.      Hx of non-ST elevation myocardial infarction (NSTEMI) (Chronic)    Distant history of inferior MI mid to distal RCA stenosis treated with PCI back in 2007.  Has had one episode of unstable angina requiring relook cath PCI 6 years ago.  Has been stable ever since. Normal EF with no further angina or heart failure.      Hyperlipidemia with target LDL less than 70 - Primary (Chronic)    On stable dose of atorvastatin.  Labs were again rechecked last year, but unfortunately were not. In order to follow-up his lipids but also his LFTs, we will have him come in in the next 6 to 8 weeks to have fasting lipid panel and chemistry panel checked.  (We will also check CBC since he is on low-dose Xarelto.)      Relevant Orders   Comprehensive Metabolic Panel (CMET)   CBC   Lipid panel      COVID-19 Education: The signs and symptoms of COVID-19 were discussed with the patient and how to seek care for testing (follow up with PCP or arrange E-visit).  The importance of social distancing was discussed today.  Time:   Today, I have spent 16 minutes with the patient with telehealth technology discussing the above problems.  There is also an additional 7 minutes of charting.   Medication Adjustments/Labs and Tests Ordered: Current medicines are reviewed at length with the patient today.  Concerns regarding medicines are outlined above.   Tests Ordered: Orders Placed This Encounter  Procedures   Comprehensive Metabolic Panel (CMET)   CBC   Lipid panel    Medication Changes: No orders of the defined types were placed in this encounter.   Disposition:  Follow up in 1 year(s)  Signed, Bryan Lemma, MD  06/06/2018 8:32 AM    [facility]

## 2018-06-06 NOTE — Assessment & Plan Note (Signed)
Distant history of inferior MI mid to distal RCA stenosis treated with PCI back in 2007.  Has had one episode of unstable angina requiring relook cath PCI 6 years ago.  Has been stable ever since. Normal EF with no further angina or heart failure.

## 2018-06-06 NOTE — Assessment & Plan Note (Signed)
Blood pressure is usually well controlled on his beta-blocker and ACE inhibitor. Continue to monitor but no change.

## 2018-06-06 NOTE — Assessment & Plan Note (Signed)
On stable dose of atorvastatin.  Labs were again rechecked last year, but unfortunately were not. In order to follow-up his lipids but also his LFTs, we will have him come in in the next 6 to 8 weeks to have fasting lipid panel and chemistry panel checked.  (We will also check CBC since he is on low-dose Xarelto.)

## 2018-07-24 ENCOUNTER — Other Ambulatory Visit: Payer: Self-pay | Admitting: Cardiology

## 2018-08-16 ENCOUNTER — Other Ambulatory Visit: Payer: Self-pay | Admitting: Cardiology

## 2018-12-11 ENCOUNTER — Other Ambulatory Visit: Payer: Self-pay

## 2018-12-11 DIAGNOSIS — Z20822 Contact with and (suspected) exposure to covid-19: Secondary | ICD-10-CM

## 2018-12-13 LAB — NOVEL CORONAVIRUS, NAA: SARS-CoV-2, NAA: NOT DETECTED

## 2019-04-29 ENCOUNTER — Ambulatory Visit: Payer: BC Managed Care – PPO | Attending: General Practice

## 2019-04-29 DIAGNOSIS — Z23 Encounter for immunization: Secondary | ICD-10-CM | POA: Insufficient documentation

## 2019-04-29 NOTE — Progress Notes (Signed)
   Covid-19 Vaccination Clinic  Name:  [name]    MRN: [id number] DOB: 12-May-1955  04/29/2019  [name] was observed post Covid-19 immunization for 15 minutes without incident. He was provided with Vaccine Information Sheet and instruction to access the V-Safe system.   [name] was instructed to call 911 with any severe reactions post vaccine: [name] Difficulty breathing  . Swelling of face and throat  . A fast heartbeat  . A bad rash all over body  . Dizziness and weakness   Immunizations Administered    Name Date Dose VIS Date Route   Pfizer COVID-19 Vaccine 04/29/2019 11:25 AM 0.3 mL 02/02/2019 Intramuscular   Manufacturer: [employer], [employer]   Lot: [id number]   NDC: [address]-1

## 2019-05-29 ENCOUNTER — Ambulatory Visit: Payer: BC Managed Care – PPO | Attending: Internal Medicine

## 2019-05-29 DIAGNOSIS — Z23 Encounter for immunization: Secondary | ICD-10-CM

## 2019-05-29 NOTE — Progress Notes (Signed)
   Covid-19 Vaccination Clinic  Name:  GAETAN SPIEKER    MRN: [id number] DOB: [birth year]  05/29/2019  Mr. Enochs was observed post Covid-19 immunization for 15 minutes without incident. He was provided with Vaccine Information Sheet and instruction to access the V-Safe system.   Mr. Hallum was instructed to call 911 with any severe reactions post vaccine: [name] Difficulty breathing  . Swelling of face and throat  . A fast heartbeat  . A bad rash all over body  . Dizziness and weakness   Immunizations Administered    Name Date Dose VIS Date Route   Pfizer COVID-19 Vaccine 05/29/2019 11:42 AM 0.3 mL 02/02/2019 Intramuscular   Manufacturer: [employer], [employer]   Lot: [id number]   NDC: [address]-1

## 2019-06-12 ENCOUNTER — Ambulatory Visit: Payer: BC Managed Care – PPO | Admitting: Cardiology

## 2019-07-04 ENCOUNTER — Other Ambulatory Visit: Payer: Self-pay

## 2019-07-04 DIAGNOSIS — I2510 Atherosclerotic heart disease of native coronary artery without angina pectoris: Secondary | ICD-10-CM

## 2019-07-04 DIAGNOSIS — Z79899 Other long term (current) drug therapy: Secondary | ICD-10-CM

## 2019-07-04 DIAGNOSIS — E785 Hyperlipidemia, unspecified: Secondary | ICD-10-CM

## 2019-07-04 DIAGNOSIS — Z9861 Coronary angioplasty status: Secondary | ICD-10-CM

## 2019-07-04 NOTE — Progress Notes (Signed)
Pt in office to have labs drawn. Orders for Lipid Panel, CBC, and CMET from 06/06/19 have expired. New orders placed.

## 2019-07-05 LAB — COMPREHENSIVE METABOLIC PANEL
ALT: 34 IU/L (ref 0–44)
AST: 36 IU/L (ref 0–40)
Albumin/Globulin Ratio: 2.3 — ABNORMAL HIGH (ref 1.2–2.2)
Albumin: 4.5 g/dL (ref 3.8–4.8)
Alkaline Phosphatase: 73 IU/L (ref 39–117)
BUN/Creatinine Ratio: 14 (ref 10–24)
BUN: 10 mg/dL (ref 8–27)
Bilirubin Total: 0.6 mg/dL (ref 0.0–1.2)
CO2: 27 mmol/L (ref 20–29)
Calcium: 9 mg/dL (ref 8.6–10.2)
Chloride: 103 mmol/L (ref 96–106)
Creatinine, Ser: 0.74 mg/dL — ABNORMAL LOW (ref 0.76–1.27)
GFR calc Af Amer: 115 mL/min/{1.73_m2} (ref >59)
GFR calc non Af Amer: 100 mL/min/{1.73_m2} (ref >59)
Globulin, Total: 2 g/dL (ref 1.5–4.5)
Glucose: 89 mg/dL (ref 65–99)
Potassium: 5.1 mmol/L (ref 3.5–5.2)
Sodium: 141 mmol/L (ref 134–144)
Total Protein: 6.5 g/dL (ref 6.0–8.5)

## 2019-07-05 LAB — CBC
Hematocrit: 45.5 % (ref 37.5–51.0)
Hemoglobin: 15.5 g/dL (ref 13.0–17.7)
MCH: 33.3 pg — ABNORMAL HIGH (ref 26.6–33.0)
MCHC: 34.1 g/dL (ref 31.5–35.7)
MCV: 98 fL — ABNORMAL HIGH (ref 79–97)
Platelets: 161 10*3/uL (ref 150–450)
RBC: 4.66 x10E6/uL (ref 4.14–5.80)
RDW: 12.7 % (ref 11.6–15.4)
WBC: 7.6 10*3/uL (ref 3.4–10.8)

## 2019-07-05 LAB — LIPID PANEL
Chol/HDL Ratio: 2.2 ratio (ref 0.0–5.0)
Cholesterol, Total: 148 mg/dL (ref 100–199)
HDL: 67 mg/dL (ref >39)
LDL Chol Calc (NIH): 71 mg/dL (ref 0–99)
Triglycerides: 46 mg/dL (ref 0–149)
VLDL Cholesterol Cal: 10 mg/dL (ref 5–40)

## 2019-07-13 ENCOUNTER — Other Ambulatory Visit: Payer: Self-pay | Admitting: Cardiology

## 2019-07-13 NOTE — Telephone Encounter (Signed)
CAD therapy with low dose ASA

## 2019-07-16 ENCOUNTER — Encounter: Payer: Self-pay | Admitting: Cardiology

## 2019-07-16 ENCOUNTER — Other Ambulatory Visit: Payer: Self-pay

## 2019-07-16 ENCOUNTER — Ambulatory Visit: Payer: BC Managed Care – PPO | Admitting: Cardiology

## 2019-07-16 VITALS — BP 162/70 | HR 60 | Ht 65.0 in | Wt 150.2 lb

## 2019-07-16 DIAGNOSIS — E785 Hyperlipidemia, unspecified: Secondary | ICD-10-CM | POA: Diagnosis not present

## 2019-07-16 DIAGNOSIS — I252 Old myocardial infarction: Secondary | ICD-10-CM | POA: Diagnosis not present

## 2019-07-16 DIAGNOSIS — I10 Essential (primary) hypertension: Secondary | ICD-10-CM

## 2019-07-16 DIAGNOSIS — Z9861 Coronary angioplasty status: Secondary | ICD-10-CM

## 2019-07-16 DIAGNOSIS — I2510 Atherosclerotic heart disease of native coronary artery without angina pectoris: Secondary | ICD-10-CM | POA: Diagnosis not present

## 2019-07-16 NOTE — Progress Notes (Signed)
Primary Care Provider: Carney Living, MD Cardiologist: Bryan Lemma, MD Electrophysiologist: None  Clinic Note: Chief Complaint  Patient presents with  . Follow-up    Annual; recent labs  . Coronary Artery Disease    No angina  . Hypertension    He has a BP log of the last week range 121/74-134/77 mmHg    HPI:    [name] is a 63 y.o. male with a PMH notable for CAD (NSTEMI in 2007 with PCI of the RCA, class II-III angina with abnormal Myoview May 2014-PCI to RCA and LAD, nonischemic FFR of OM1) who presents today for annual follow-up.   September 2007-non-STEMI: PCI to the RCA with 2 Cypher DES   May 2014: Abnormal Myoview high risk class II angina. Multivessel CAD.PCItoRCA and LAD, nonischemic FFR of OM1  ` Was converted from Effient to low-dose Xarelto and aspirin for maintenance.  [name] was last seen in person on May 06, 2017, but he was last seen via telemedicine on June 06, 2018  Recent Hospitalizations: None  Reviewed  CV studies:    The following studies were reviewed today: (if available, images/films reviewed: From Epic Chart or Care Everywhere) . None:   Interval History:   [name] returns today for impression annual follow-up doing quite well.  He is very happy now that he has had his Covid vaccine, and things opened up in the campus.  He still works as a Comptroller at [employer], and was able to get his vaccine through [employer].  He is now taking full advantage of the rec center being open and is been going there at least 3 to 4 days a week doing light weights and other calisthenic exercises in addition to his routine walk.  He is pretty much stable cardiac standpoint with no angina or heart failure symptoms.  He is of the today's been looked under stress with some work-related projects he is try to get done, and was running late getting into this appointment.  He tells me that at home his blood pressure is usually in  the 120 to 130/70s mmHg range.  CV Review of Symptoms (Summary) Cardiovascular ROS: no chest pain or dyspnea on exertion negative for - edema, irregular heartbeat, loss of consciousness, orthopnea, palpitations, paroxysmal nocturnal dyspnea, rapid heart rate, shortness of breath or Near syncope, TIA/amaurosis fugax, claudication  The patient does not have symptoms concerning for COVID-19 infection (fever, chills, cough, or new shortness of breath).  The patient is practicing social distancing & Masking.   COVID-19 vaccine calcium vaccine clinic March 7 in May 29, 2019   REVIEWED OF SYSTEMS   ROS Environmental allergies  I have reviewed and (if needed) personally updated the patient's problem list, medications, allergies, past medical and surgical history, social and family history.   PAST MEDICAL HISTORY   Past Medical History:  Diagnosis Date  . CAD S/P percutaneous coronary angioplasty 10/2005, 06/2012   a) Inf NSTEMI - distal RCA 90% - PCI; 60% rPDA -- PCI dRCA-PDA: 2 overlapping Cypher DES 3.0 x 13, 3.0 x 18;; b) Crescendo Angina - Abn Myoview with Inf Ischemia --> mLAD 70%-95% (after D1) - PCI w/ Promus P. DES 2.25 x 28 (2.45 mm); p-mRCA 60%, 95%-60%--> PCI w/ 2 overlapping Promus P DES 3.0 x 24 & 3.5 x 18 (dilated p-d: 3.88 - 4.1 - 4.23 - 3.68); OM1 - FFR 0.80 ( Med Rx)  . HTN (hypertension)   . Hx of non-ST  elevation myocardial infarction (NSTEMI) 10/2005   Inf MI - distal RCA 90% - PCI; 60% rPDA; LCA minimal disease; Normal EF without WMA  . Hyperlipidemia LDL goal <70     PAST SURGICAL HISTORY   Past Surgical History:  Procedure Laterality Date  . CATARACT EXTRACTION W/ INTRAOCULAR LENS  IMPLANT, BILATERAL Bilateral 1991-  1992  . LEFT HEART CATHETERIZATION WITH CORONARY ANGIOGRAM N/A 06/28/2012   Procedure: LEFT HEART CATHETERIZATION WITH CORONARY ANGIOGRAM;  Surgeon: Leonie Man, MD;  Location: [facility] CATH LAB;  Service: Cardiovascular::  mid LAD 70% & 95% after D1; OM1  70%, RCA: prox 60%, mid 95% with ectasia, distal mid 60%, then distal Cypeher stents going into RPAV - patent;; FFR of OM1 ~0.8 (borderline significant)  . PERCUTANEOUS CORONARY STENT INTERVENTION (PCI-S) N/A 06/29/2012   Procedure: PERCUTANEOUS CORONARY STENT INTERVENTION (PCI-S);  Surgeon: Leonie Man, MD;  Location: [facility] CATH LAB;  Service: Cardiovascular:: RCA - 2 overlapping Promus Premier DES 3.0 mm x 24 mm -- 3.5 mm x 18 mm (dilated prox to distal to: 3.88 -- 4.1, 4.25 - 3.68); LAD - Promus Premier DES 2.25 mm x 28 mm - dilated to 2.45 mm  . PERCUTANEOUS CORONARY STENT INTERVENTION (PCI-S)  10/2005   "2";  Distal RCA: Cypher DES 3.0 mm x 21mm, 3.0 mm x 18 mm overlapping; (06/28/2012)    Myoview June 13, 2012: Reversible perfusion defect noted in the inferior and inferolateral wall (medium to large size, moderate severe intensity.  New compared to 2008.  Findings consistent with severe RCA distribution ischemia-I have referred for cath.  MEDICATIONS/ALLERGIES   Current Meds  Medication Sig  . aspirin EC 81 MG tablet Take 1 tablet (81 mg total) by mouth daily.  [name] atorvastatin (LIPITOR) 40 MG tablet TAKE 1 TABLET (40 MG TOTAL) BY MOUTH DAILY.  . metoprolol succinate (TOPROL-XL) 25 MG 24 hr tablet TAKE 1 TABLET (25 MG TOTAL) BY MOUTH DAILY.  . nitroGLYCERIN (NITROSTAT) 0.4 MG SL tablet Place 1 tablet (0.4 mg total) under the tongue every 5 (five) minutes as needed for chest pain.  . ramipril (ALTACE) 10 MG capsule TAKE 1 CAPSULE (10 MG TOTAL) BY MOUTH DAILY.  [name] XARELTO 2.5 MG TABS tablet TAKE 1 TABLET BY MOUTH TWICE A DAY    Allergies  Allergen Reactions  . Simvastatin Other (See Comments)    Dizzy, spaced out    SOCIAL HISTORY/FAMILY HISTORY   Reviewed in Epic:  Pertinent findings: Now working out 3 to 4 days a week at the [place], EKG, labs   Wt Readings from Last 3 Encounters:  07/16/19 150 lb 3.2 oz (68.1 kg)  06/06/18 140 lb (63.5 kg)    06/06/17 146 lb (66.2 kg)    Physical Exam: BP (!) 162/70   Pulse 60   Ht 5\' 5"  (1.651 m)   Wt 150 lb 3.2 oz (68.1 kg)   BMI 24.99 kg/m  Physical Exam  Constitutional: He is oriented to person, place, and time. He appears well-developed and well-nourished. No distress.  Healthy-appearing.  Well-groomed  HENT:  Head: Normocephalic and atraumatic.  Neck: No hepatojugular reflux and no JVD present. Carotid bruit is not present.  Cardiovascular: Normal rate, regular rhythm, normal heart sounds, intact distal pulses and normal pulses.  No extrasystoles are present. PMI is not displaced. Exam reveals no gallop and no friction rub.  No murmur heard. Pulmonary/Chest: Effort normal and breath sounds normal. No respiratory distress. He has  no wheezes. He has no rales.  Abdominal: Soft. Bowel sounds are normal. He exhibits no distension. There is no abdominal tenderness.  No HSM  Musculoskeletal:        General: No edema. Normal range of motion.     Cervical back: Normal range of motion and neck supple.  Neurological: He is alert and oriented to person, place, and time.  Skin: Skin is warm and dry.  Psychiatric: He has a normal mood and affect. His behavior is normal. Judgment and thought content normal.  Vitals reviewed.    Adult ECG Report  Rate: 60 ;  Rhythm: normal sinus rhythm and Normal axis, intervals and durations.;   Narrative Interpretation: Normal EKG  Recent Labs:    Lab Results  Component Value Date   CHOL 148 07/04/2019   HDL 67 07/04/2019   LDLCALC 71 07/04/2019   TRIG 46 07/04/2019   CHOLHDL 2.2 07/04/2019   Lab Results  Component Value Date   CREATININE 0.74 (L) 07/04/2019   BUN 10 07/04/2019   NA 141 07/04/2019   K 5.1 07/04/2019   CL 103 07/04/2019   CO2 27 07/04/2019   No results found for: TSH  ASSESSMENT/PLAN    Problem List Items Addressed This Visit    CAD S/P RCA & LAD PCI with mod-severe OM1 lesion ( FFR 0.8) - Primary (Chronic)    a) Inf  NSTEMI - distal RCA 90% - PCI; 60% rPDA -- PCI dRCA-PDA: 2 overlapping Cypher DES 3.0 x 13, 3.0 x 18;; b) Crescendo Angina - Abn Myoview with Inf Ischemia --> mLAD 70%-95% (after D1) - PCI w/ Promus P. DES 2.25 x 28 (2.45 mm); p-mRCA 60%, 95%-60%--> PCI w/ 2 overlapping Promus P DES 3.0 x 24 & 3.5 x 18 (dilated p-d: 3.88 - 4.1 - 4.23 - 3.68); OM1 - FFR 0.80 ( Med Rx).  2 overlapping Cypher stents in the RCA, single LAD stent as well as overlapping stents RCA.  On stable dose of beta-blocker, ACE inhibitor and statin. Mihai status aspirin plus low-dose Xarelto-having less bruising. -> Okay to hold Xarelto 2 to 3 days free now for procedures or surgeries depending on the procedure.      Relevant Orders   EKG 12-Lead (Completed)   Essential hypertension (Chronic)     Although his blood pressure gets quite high today, he brings with him measurements of blood pressures for the last several days -> all in the 120 to 130 mmHg range.   Continue current dose of beta-blocker and ACE inhibitor.      Hyperlipidemia with target LDL less than 70 (Chronic)    Labs pretty well controlled on current dose of atorvastatin.  Recently checked prior to this visit.  Okay to continue checking annually.      Hx of non-ST elevation myocardial infarction (NSTEMI) (Chronic)    History of inferior MI with distal RCA stenosis--PCI of RCA in 2007.  Had Myoview with ischemia in the inferior and inferolateral distribution in 2014 treated with PCI to the RCA, LAD.  Has remained stable ever since.  No angina or heart failure symptoms.  Preserved EF on LV gram.      Relevant Orders   EKG 12-Lead (Completed)       COVID-19 Education: The signs and symptoms of COVID-19 were discussed with the patient and how to seek care for testing (follow up with PCP or arrange E-visit).   The importance of social distancing and COVID-19 vaccination was discussed today.  I  spent a total of with the patient. >  50% of  the time was spent in direct patient consultation.  Additional time spent with chart review  / charting (studies, outside notes, etc): 7 Total Time: 28 min   Current medicines are reviewed at length with the patient today.  (+/- concerns) none  Notice: This dictation was prepared with Dragon dictation along with smaller phrase technology. Any transcriptional errors that result from this process are unintentional and may not be corrected upon review.  Patient Instructions / Medication Changes & Studies & Tests Ordered   Patient Instructions  Medication Instructions:  No changes  *If you need a refill on your cardiac medications before your next appointment, please call your pharmacy*   Lab Work: Not needed   Testing/Procedures: Not needed   Follow-Up: At [facility], you and your health needs are our priority.  As part of our continuing mission to provide you with exceptional heart care, we have created designated Provider Care Teams.  These Care Teams include your primary Cardiologist (physician) and Advanced Practice Providers (APPs -  Physician Assistants and Nurse Practitioners) who all work together to provide you with the care you need, when you need it.    Your next appointment:   12 month(s)- May 2021  The format for your next appointment:   In Person  Provider:   Bryan Lemma, MD  Keep a eye on your Blood pressures- contact office if consistently elevated     Studies Ordered:   Orders Placed This Encounter  Procedures  . EKG 12-Lead     Bryan Lemma, M.D., M.S. Interventional Cardiologist   Pager # [phone] Phone # [phone] [address] [place], [place] [address]   Thank you for choosing Heartcare at [facility]!!

## 2019-07-16 NOTE — Assessment & Plan Note (Signed)
Labs pretty well controlled on current dose of atorvastatin.  Recently checked prior to this visit.  Okay to continue checking annually.

## 2019-07-16 NOTE — Assessment & Plan Note (Signed)
History of inferior MI with distal RCA stenosis--PCI of RCA in 2007.  Had Myoview with ischemia in the inferior and inferolateral distribution in 2014 treated with PCI to the RCA, LAD.  Has remained stable ever since.  No angina or heart failure symptoms.  Preserved EF on LV gram.

## 2019-07-16 NOTE — Assessment & Plan Note (Addendum)
  Although his blood pressure gets quite high today, he brings with him measurements of blood pressures for the last several days -> all in the 120 to 130 mmHg range.   Continue current dose of beta-blocker and ACE inhibitor.

## 2019-07-16 NOTE — Assessment & Plan Note (Signed)
a) Inf NSTEMI - distal RCA 90% - PCI; 60% rPDA -- PCI dRCA-PDA: 2 overlapping Cypher DES 3.0 x 13, 3.0 x 18;; b) Crescendo Angina - Abn Myoview with Inf Ischemia --> mLAD 70%-95% (after D1) - PCI w/ Promus P. DES 2.25 x 28 (2.45 mm); p-mRCA 60%, 95%-60%--> PCI w/ 2 overlapping Promus P DES 3.0 x 24 & 3.5 x 18 (dilated p-d: 3.88 - 4.1 - 4.23 - 3.68); OM1 - FFR 0.80 ( Med Rx).  2 overlapping Cypher stents in the RCA, single LAD stent as well as overlapping stents RCA.  On stable dose of beta-blocker, ACE inhibitor and statin. Mihai status aspirin plus low-dose Xarelto-having less bruising. -> Okay to hold Xarelto 2 to 3 days free now for procedures or surgeries depending on the procedure.

## 2019-07-16 NOTE — Patient Instructions (Signed)
Medication Instructions:  No changes  *If you need a refill on your cardiac medications before your next appointment, please call your pharmacy*   Lab Work: Not needed   Testing/Procedures: Not needed   Follow-Up: At [facility], you and your health needs are our priority.  As part of our continuing mission to provide you with exceptional heart care, we have created designated Provider Care Teams.  These Care Teams include your primary Cardiologist (physician) and Advanced Practice Providers (APPs -  Physician Assistants and Nurse Practitioners) who all work together to provide you with the care you need, when you need it.    Your next appointment:   12 month(s)- May 2021  The format for your next appointment:   In Person  Provider:   Bryan Lemma, MD  Keep a eye on your Blood pressures- contact office if consistently elevated

## 2019-07-21 ENCOUNTER — Encounter: Payer: Self-pay | Admitting: Cardiology

## 2019-08-03 ENCOUNTER — Other Ambulatory Visit: Payer: Self-pay | Admitting: Cardiology

## 2019-08-06 ENCOUNTER — Other Ambulatory Visit: Payer: Self-pay | Admitting: Cardiology

## 2019-08-09 ENCOUNTER — Other Ambulatory Visit: Payer: Self-pay | Admitting: Cardiology

## 2019-08-09 NOTE — Telephone Encounter (Signed)
*  STAT* If patient is at the pharmacy, call can be transferred to refill team.   1. Which medications need to be refilled? (please list name of each medication and dose if known) metoprolol succinate (TOPROL-XL) 25 MG 24 hr tablet /   ramipril (ALTACE) 10 MG capsule     2. Which pharmacy/location (including street and city if local pharmacy) is medication to be sent to? [id number] - [place], [place] - [address]  3. Do they need a 30 day or 90 day supply? 90

## 2019-08-10 MED ORDER — RAMIPRIL 10 MG PO CAPS: 10.0000 mg | ORAL_CAPSULE | Freq: Every day | ORAL | 3 refills | Status: DC

## 2019-08-10 MED ORDER — METOPROLOL SUCCINATE ER 25 MG PO TB24: 25.0000 mg | ORAL_TABLET | Freq: Every day | ORAL | 3 refills | Status: DC

## 2020-01-10 ENCOUNTER — Other Ambulatory Visit: Payer: Self-pay | Admitting: Cardiology

## 2020-01-11 NOTE — Telephone Encounter (Signed)
Pt requests refill for xarelto 2.5mg  I am not allowed to refill those so I will route to the pharmd pool for assesment 30m 68.1kg Scr 0.74(07/04/19) ccr99.7 Lovw/harding (06/06/19)

## 2020-01-23 ENCOUNTER — Ambulatory Visit: Payer: BC Managed Care – PPO | Admitting: Family Medicine

## 2020-01-23 ENCOUNTER — Other Ambulatory Visit: Payer: Self-pay

## 2020-01-23 ENCOUNTER — Encounter: Payer: Self-pay | Admitting: Family Medicine

## 2020-01-23 DIAGNOSIS — M542 Cervicalgia: Secondary | ICD-10-CM | POA: Insufficient documentation

## 2020-01-23 NOTE — Patient Instructions (Signed)
Good to see you today!  Thanks for coming in.  I would try topical Aspercreme or Diclofenac gel can use up to 4 x a day with Tylenol  Try to avoid NSAIDS by mouth  Limit Tylenol to 3000 mg a day  If not better in 4-6 weeks or if worsening then let me know

## 2020-01-23 NOTE — Assessment & Plan Note (Signed)
Very consistent with musculoskeletal cause.  No weakness or radicular pain.  Continue analgesics.  Recommend against muscle relaxers.  See after visit summary for cautions

## 2020-01-23 NOTE — Progress Notes (Signed)
    SUBJECTIVE:   CHIEF COMPLAINT / HPI:   PAIN LEFT NECK For last week.  No specific injury but was playing mandolin a lot.  No weakness or lower extremity pain.  Better with tylenol and pain patch.   Wonders what causing it  PERTINENT  PMH / PSH: Retiring in a few months  Wife is Manufacturing systems engineer  OBJECTIVE:   BP (!) 148/100   Pulse 68   Ht 5\' 5"  (1.651 m)   Wt 149 lb 6.4 oz (67.8 kg)   SpO2 98%   BMI 24.86 kg/m   Alert nad Focally mildly tender over left mid thoracic area.  No deformity Neck FROM L shoulder FROM Heart - Regular rate and rhythm.  No murmurs, gallops or rubs.    Lungs:  Normal respiratory effort, chest expands symmetrically. Lungs are clear to auscultation, no crackles or wheezes.   ASSESSMENT/PLAN:   Neck pain Very consistent with musculoskeletal cause.  No weakness or radicular pain.  Continue analgesics.  Recommend against muscle relaxers.  See after visit summary for cautions      , MD [facility]

## 2020-05-07 ENCOUNTER — Other Ambulatory Visit: Payer: Self-pay

## 2020-05-07 ENCOUNTER — Ambulatory Visit: Payer: BC Managed Care – PPO | Admitting: Family Medicine

## 2020-05-07 ENCOUNTER — Encounter: Payer: Self-pay | Admitting: Family Medicine

## 2020-05-07 DIAGNOSIS — I10 Essential (primary) hypertension: Secondary | ICD-10-CM | POA: Diagnosis not present

## 2020-05-07 DIAGNOSIS — F419 Anxiety disorder, unspecified: Secondary | ICD-10-CM

## 2020-05-07 NOTE — Patient Instructions (Addendum)
Good to see you today - Thank you for coming in  Things we discussed today:  Alcohol - cut back to no more than 2 drinks per week   Therapy and Counseling Resources Most providers on this list will take Medicaid. Patients with commercial insurance or Medicare should contact their insurance company to get a list of in network providers.  Specific Provider options Psychology Today  https://www.psychologytoday.com/us 1. click on find a therapist  2. enter your zip code 3. left side and select or tailor a therapist for your specific need.  Dr Spero Geralds - [address] [place], [place] [address]  [phone]  [phone]  24- Hour Availability:  .  [name] [facility]  . [address] [place], [place] [employer] [phone] Crisis [phone]  . [facility] [phone]  [facility] Crisis Service  [phone]   . [facility] [employer]  [phone] (after hours)  . Therapeutic Alternative/Mobile Crisis   [phone]  . [place] National Suicide Hotline  5390720252 (TALK)   Please always bring your medication bottles  Come back to see me in 3-4 weeks

## 2020-05-07 NOTE — Assessment & Plan Note (Signed)
His history and GAD scores seem consistent with GAD.  He would prefer counseling not medication.  He does not wish to have his TSH checked today

## 2020-05-07 NOTE — Assessment & Plan Note (Signed)
Not at goal today but likely due to stress of visit.  He will monitor at home

## 2020-05-07 NOTE — Progress Notes (Signed)
    SUBJECTIVE:   CHIEF COMPLAINT / HPI:   ANXIETY Would like to discuss his anxiety, which has been for a long time, but has worsened with covid.  He feels nervous and tight when going out in public and anxious about possible bad events.  He has never had therapy or medication.   He does feel his drinking has increased with this usually having 2 drinks per day sometimes up to 6.  This has not interfered with his functioning or relationships.  No suicidal ideation.  No weight loss or heat intoleranc   Neck Pain Improved with otc analgesics   Elevated BP - he feels this is due to stress of visit and did not want to repeat it here.  Follows this at home and has been controlled   PERTINENT  PMH / PSH: CAD  OBJECTIVE:   BP (!) 189/74   Pulse 75   Ht 5\' 5"  (1.651 m)   Wt 154 lb 6.4 oz (70 kg)   SpO2 99%   BMI 25.69 kg/m   Psych:  Cognition and judgment appear intact. Alert, communicative  and cooperative with normal attention span and concentration. No apparent delusions, illusions, hallucinations   ASSESSMENT/PLAN:   Anxiety His history and GAD scores seem consistent with GAD.  He would prefer counseling not medication.  He does not wish to have his TSH checked today   Essential hypertension Not at goal today but likely due to stress of visit.  He will monitor at home      , MD [facility]

## 2020-05-21 ENCOUNTER — Other Ambulatory Visit: Payer: Self-pay | Admitting: Cardiology

## 2020-06-24 ENCOUNTER — Other Ambulatory Visit (INDEPENDENT_AMBULATORY_CARE_PROVIDER_SITE_OTHER): Payer: BC Managed Care – PPO

## 2020-06-24 ENCOUNTER — Other Ambulatory Visit: Payer: Self-pay

## 2020-06-24 DIAGNOSIS — F419 Anxiety disorder, unspecified: Secondary | ICD-10-CM

## 2020-06-24 DIAGNOSIS — E785 Hyperlipidemia, unspecified: Secondary | ICD-10-CM

## 2020-06-25 LAB — COMPREHENSIVE METABOLIC PANEL
ALT: 37 IU/L (ref 0–44)
AST: 33 IU/L (ref 0–40)
Albumin/Globulin Ratio: 2.4 — ABNORMAL HIGH (ref 1.2–2.2)
Albumin: 4.6 g/dL (ref 3.8–4.8)
Alkaline Phosphatase: 70 IU/L (ref 44–121)
BUN/Creatinine Ratio: 16 (ref 10–24)
BUN: 13 mg/dL (ref 8–27)
Bilirubin Total: 0.6 mg/dL (ref 0.0–1.2)
CO2: 21 mmol/L (ref 20–29)
Calcium: 9.4 mg/dL (ref 8.6–10.2)
Chloride: 99 mmol/L (ref 96–106)
Creatinine, Ser: 0.8 mg/dL (ref 0.76–1.27)
Globulin, Total: 1.9 g/dL (ref 1.5–4.5)
Glucose: 128 mg/dL — ABNORMAL HIGH (ref 65–99)
Potassium: 4.7 mmol/L (ref 3.5–5.2)
Sodium: 138 mmol/L (ref 134–144)
Total Protein: 6.5 g/dL (ref 6.0–8.5)
eGFR: 100 mL/min/{1.73_m2} (ref >59)

## 2020-06-25 LAB — LIPID PANEL
Chol/HDL Ratio: 2.3 ratio (ref 0.0–5.0)
Cholesterol, Total: 157 mg/dL (ref 100–199)
HDL: 67 mg/dL (ref >39)
LDL Chol Calc (NIH): 72 mg/dL (ref 0–99)
Triglycerides: 99 mg/dL (ref 0–149)
VLDL Cholesterol Cal: 18 mg/dL (ref 5–40)

## 2020-06-25 LAB — TSH: TSH: 0.931 u[IU]/mL (ref 0.450–4.500)

## 2020-06-26 ENCOUNTER — Encounter: Payer: Self-pay | Admitting: Family Medicine

## 2020-06-27 ENCOUNTER — Encounter: Payer: Self-pay | Admitting: Family Medicine

## 2020-07-02 ENCOUNTER — Ambulatory Visit: Payer: BC Managed Care – PPO | Admitting: Family Medicine

## 2020-07-02 ENCOUNTER — Encounter: Payer: Self-pay | Admitting: Family Medicine

## 2020-07-02 ENCOUNTER — Other Ambulatory Visit: Payer: Self-pay

## 2020-07-02 VITALS — BP 116/72 | HR 76 | Wt 148.4 lb

## 2020-07-02 DIAGNOSIS — I10 Essential (primary) hypertension: Secondary | ICD-10-CM

## 2020-07-02 DIAGNOSIS — M542 Cervicalgia: Secondary | ICD-10-CM | POA: Diagnosis not present

## 2020-07-02 DIAGNOSIS — R739 Hyperglycemia, unspecified: Secondary | ICD-10-CM | POA: Diagnosis not present

## 2020-07-02 DIAGNOSIS — F419 Anxiety disorder, unspecified: Secondary | ICD-10-CM

## 2020-07-02 LAB — POCT GLYCOSYLATED HEMOGLOBIN (HGB A1C): Hemoglobin A1C: 5.4 % (ref 4.0–5.6)

## 2020-07-02 NOTE — Assessment & Plan Note (Signed)
BP Readings from Last 3 Encounters:  07/02/20 116/72  05/07/20 (!) 189/74  01/23/20 (!) 148/100   Seems to have significant white coat reaction as his repeat blood pressure was much lower than initial in office.  Asked to bring blood pressure cuff to doctors visits and to monitor at home

## 2020-07-02 NOTE — Progress Notes (Signed)
    SUBJECTIVE:   CHIEF COMPLAINT / HPI:   HYPERGLYCEMIA Noticed on recent bloodwork.  He has no symptoms  ANXIETY He feels is much improved since he stopped all alcohol.  Started going to gym and much less anxiety  Neck Pain Focal L upper back.  Worse with neck extension.  Sometimes radicular symptoms.  No weakness Improved with otc analgesics   Elevated BP - he feels this is due to stress of visit and did not want to repeat it here.  Follows this at home and has been controlled   PERTINENT  PMH / PSH: Plannnig trip to [place] in a few years   OBJECTIVE:   BP 116/72   Pulse 76   Wt 148 lb 6.4 oz (67.3 kg)   SpO2 98%   BMI 24.70 kg/m   Neck:  No deformities, thyromegaly, masses, or tenderness noted.   Supple with full range of motion with mild pain in left upper back with full extension Normal strength bilaterally in upper extremities No weakness with rotator cuff testing    ASSESSMENT/PLAN:   Essential hypertension BP Readings from Last 3 Encounters:  07/02/20 116/72  05/07/20 (!) 189/74  01/23/20 (!) 148/100   Seems to have significant white coat reaction as his repeat blood pressure was much lower than initial in office.  Asked to bring blood pressure cuff to doctors visits and to monitor at home   Anxiety Much improved with alcohol cessation.  Encouraged continued abstinence and exercise   Neck pain Consistent with mild nerve impingement.  No red flags for bony tumor or any weakness.  Continue exercise and as needed analgesics.  If interferes with life or worsens would start with cervical xray      Carney Living, MD [facility]

## 2020-07-02 NOTE — Assessment & Plan Note (Signed)
Consistent with mild nerve impingement.  No red flags for bony tumor or any weakness.  Continue exercise and as needed analgesics.  If interferes with life or worsens would start with cervical xray

## 2020-07-02 NOTE — Assessment & Plan Note (Signed)
Much improved with alcohol cessation.  Encouraged continued abstinence and exercise

## 2020-07-02 NOTE — Patient Instructions (Signed)
Good to see you today - Thank you for coming in  Things we discussed today:  Neck pain - if worsening or interfering then let me know and will get an xray  Bring your blood pressure cuff to Dr Herbie Baltimore visit to compare readings   Please always bring your medication bottles  Come back to see me in 6-12 months as needed

## 2020-07-29 ENCOUNTER — Other Ambulatory Visit: Payer: Self-pay | Admitting: Cardiology

## 2020-08-13 ENCOUNTER — Other Ambulatory Visit: Payer: Self-pay | Admitting: Cardiology

## 2020-09-15 ENCOUNTER — Other Ambulatory Visit: Payer: Self-pay | Admitting: Cardiology

## 2020-09-15 NOTE — Telephone Encounter (Signed)
Prescription refill request for Xarelto received.  Indication: Last office visit:5/21 Weight:67.3 kg Age:65 Scr:0.8 CrCl:89.97 ml/min  Prescription refilled

## 2020-10-19 ENCOUNTER — Other Ambulatory Visit: Payer: Self-pay | Admitting: Cardiology

## 2020-10-20 NOTE — Telephone Encounter (Signed)
Prescription refill request for Xarelto received.  Indication:TIA Last office visit:needs visit Weight:67.3 kg Age:65 Scr:0.8 CrCl:89.97 ml/min  Prescription refilled

## 2020-10-30 ENCOUNTER — Other Ambulatory Visit: Payer: Self-pay | Admitting: Cardiology

## 2020-11-18 ENCOUNTER — Other Ambulatory Visit: Payer: Self-pay | Admitting: Cardiology

## 2020-11-18 NOTE — Telephone Encounter (Signed)
On low dose Xarelto for CAD, BMET and CBC stable, will refill for a year as dosage will not change for this indication

## 2020-11-18 NOTE — Telephone Encounter (Signed)
Routing to pharmd pool as I cannot refill

## 2020-11-24 ENCOUNTER — Ambulatory Visit: Payer: BC Managed Care – PPO | Admitting: Cardiology

## 2020-12-15 ENCOUNTER — Ambulatory Visit: Payer: BC Managed Care – PPO | Admitting: Cardiology

## 2020-12-15 ENCOUNTER — Encounter: Payer: Self-pay | Admitting: Cardiology

## 2020-12-15 ENCOUNTER — Other Ambulatory Visit: Payer: Self-pay

## 2020-12-15 VITALS — BP 138/80 | HR 67 | Ht 65.0 in | Wt 148.6 lb

## 2020-12-15 DIAGNOSIS — Z9861 Coronary angioplasty status: Secondary | ICD-10-CM

## 2020-12-15 DIAGNOSIS — I252 Old myocardial infarction: Secondary | ICD-10-CM | POA: Diagnosis not present

## 2020-12-15 DIAGNOSIS — Z955 Presence of coronary angioplasty implant and graft: Secondary | ICD-10-CM

## 2020-12-15 DIAGNOSIS — I2510 Atherosclerotic heart disease of native coronary artery without angina pectoris: Secondary | ICD-10-CM

## 2020-12-15 DIAGNOSIS — E785 Hyperlipidemia, unspecified: Secondary | ICD-10-CM | POA: Diagnosis not present

## 2020-12-15 DIAGNOSIS — I10 Essential (primary) hypertension: Secondary | ICD-10-CM | POA: Diagnosis not present

## 2020-12-15 MED ORDER — XARELTO 2.5 MG PO TABS
2.5000 mg | ORAL_TABLET | Freq: Two times a day (BID) | ORAL | 3 refills | Status: DC
Start: 2020-12-15 — End: 2022-01-22

## 2020-12-15 MED ORDER — NITROGLYCERIN 0.4 MG SL SUBL: 0.4000 mg | SUBLINGUAL_TABLET | SUBLINGUAL | 4 refills | Status: DC | PRN

## 2020-12-15 NOTE — Progress Notes (Signed)
Primary Care Provider: Carney Living, MD Cardiologist: Bryan Lemma, MD Electrophysiologist: None  Clinic Note: Chief Complaint  Patient presents with   Follow-up    Delayed 1 year follow-up   Coronary Artery Disease    No angina   ===================================  ASSESSMENT/PLAN   Problem List Items Addressed This Visit       Cardiology Problems   CAD S/P RCA & LAD PCI with mod-severe OM1 lesion ( FFR 0.8) (Chronic)    He has had 2 separate ACS occasions, one was a non-STEMI the second was just crescendo angina.  Now has total of 4 overlapping stents in the RCA as well as 1 stent in the mid LAD.  After each occasion, he is remained angina free.  He is still doing well and exercise with no active symptoms.  Plan: On low-dose Toprol, stable dose ramipril, and moderate-dose atorvastatin He was converted from Thienopyridine to aspirin plus Xarelto and low-dose for maintenance Okay to hold Xarelto 2 to 3 days preop for surgeries or procedures.  Also okay to hold aspirin 5 days preop      Relevant Medications   rivaroxaban (XARELTO) 2.5 MG TABS tablet   nitroGLYCERIN (NITROSTAT) 0.4 MG SL tablet   Other Relevant Orders   EKG 12-Lead (Completed)   Essential hypertension (Chronic)    He has had some high blood pressure readings in the past, clearly has whitecoat syndrome issues based on his home recordings are.  He remains on low-dose Toprol along with 10 mg ramipril.  I do not think we should increase the beta-blocker because of issues with bradycardia in past.  Could potentially consider diuretic if additional blood pressure is required.      Relevant Medications   rivaroxaban (XARELTO) 2.5 MG TABS tablet   nitroGLYCERIN (NITROSTAT) 0.4 MG SL tablet   Hyperlipidemia with target LDL less than 70 - Primary (Chronic)    Most recent labs have his LDL being 72 which is up from before.  He would like to avoid additional medications, therefore she can make  conscious effort to readjust his diet.  If not at goal follow-up, would probably need to be more aggressive with statin and potentially convert to rosuvastatin from atorvastatin.      Relevant Medications   rivaroxaban (XARELTO) 2.5 MG TABS tablet   nitroGLYCERIN (NITROSTAT) 0.4 MG SL tablet     Other   Presence of drug coated stent in RCA and LAD (Chronic)    He had been on long-term Thienopyridine because of his extensive PCI.  This was converted to low-dose Xarelto (2.5 mg twice daily) plus aspirin for maintenance therapy.  Less bleeding and bruising, and less cost prohibitive.  Okay to hold stroke 2 to 3 days preop for surgical procedure, Okay to hold aspirin 5 days preop for surgery or procedure.      Hx of non-ST elevation myocardial infarction (NSTEMI) (Chronic)    He had a history of inferior non-STEMI back in 2007 with RCA PCI followed by ACS unstable angina in 2014 with additional stents placed to the RCA as well as LAD.   No recurrent angina or CHF symptoms.  Preserved EF on echo..       Relevant Orders   EKG 12-Lead (Completed)    ===================================  HPI:    [name] is a 65 y.o. male with a PMH notable for CAD (non-STEMI 2007 with RCA PCI followed by Class III Angina May 2014 with PCI to the RCA and LAD,  Med Rx of  OM1 - FFR non-ischemic), HTN and HLD who presents today for delayed annual follow-up -initial appointment had to be rescheduled.  [name] was last seen on 06/26/2019.  As usual he is doing very well.  Was happy to have had his COVID-19 vaccine making him feel more comfortable being back to work.  He was contemplating retirement at that time.  As usual, he was staying very active doing 3 to 4 days a week working out the gym with machines and calisthenics routine walk.  No angina or heart failure symptoms.  Blood pressures at home range in the 120/70-130/80 range.   Recent Hospitalizations: none  Reviewed  CV studies:    The  following studies were reviewed today: (if available, images/films reviewed: From Epic Chart or Care Everywhere) none:   Interval History:   [name] returns here today again doing well from a cardiac standpoint.  Very active.  He is now happily retired as of January this year.  Enjoying the extra free time and less stress.  He has been taking full advantage of the facilities at the rec center when it comes to going to the gym, pool, etc.  He also walks 4 to 5 days a week in addition to his exercises.  He also uses a free time being tired to do more yard work.  He also is hoping that as COVID restrictions lightened, he can take and manage at this new freedom to go traveling more.  He is open to go back to [place].  No active cardiac symptoms: CV Review of Symptoms (Summary): no chest pain or dyspnea on exertion negative for - edema, irregular heartbeat, orthopnea, palpitations, paroxysmal nocturnal dyspnea, rapid heart rate, shortness of breath, or lightheadedness, dizziness or wooziness, syncope/near syncope or TIA/amaurosis fugax.  Claudication  REVIEWED OF SYSTEMS   Review of Systems  Constitutional:  Negative for malaise/fatigue and weight loss.  Respiratory:  Negative for cough and shortness of breath.   Cardiovascular:  Negative for leg swelling.  Gastrointestinal:  Negative for blood in stool and melena.  Genitourinary:  Negative for hematuria.  Musculoskeletal:  Negative for joint pain (Only mild aches and pains) and myalgias.  Neurological:  Positive for dizziness (Sometimes positional, but very rare.  Usually if he has not been drinking enough.). Negative for focal weakness, seizures and weakness.  Psychiatric/Behavioral: Negative.         Much less stressed now that he is not working.   I have reviewed and (if needed) personally updated the patient's problem list, medications, allergies, past medical and surgical history, social and family history.   PAST MEDICAL HISTORY    Past Medical History:  Diagnosis Date   CAD S/P percutaneous coronary angioplasty 10/2005, 06/2012   a) Inf NSTEMI - distal RCA 90% - PCI; 60% rPDA -- PCI dRCA-PDA: 2 overlapping Cypher DES 3.0 x 13, 3.0 x 18;; b) Crescendo Angina - Abn Myoview with Inf Ischemia --> mLAD 70%-95% (after D1) - PCI w/ Promus P. DES 2.25 x 28 (2.45 mm); p-mRCA 60%, 95%-60%--> PCI w/ 2 overlapping Promus P DES 3.0 x 24 & 3.5 x 18 (dilated p-d: 3.88 - 4.1 - 4.23 - 3.68); OM1 - FFR 0.80 ( Med Rx)   HTN (hypertension)    Hx of non-ST elevation myocardial infarction (NSTEMI) 10/2005   Inf MI - distal RCA 90% - PCI; 60% rPDA; LCA minimal disease; Normal EF without WMA   Hyperlipidemia LDL goal <70  PAST SURGICAL HISTORY   Past Surgical History:  Procedure Laterality Date   CATARACT EXTRACTION W/ INTRAOCULAR LENS  IMPLANT, BILATERAL Bilateral 1991-  1992   LEFT HEART CATHETERIZATION WITH CORONARY ANGIOGRAM N/A 06/28/2012   Procedure: LEFT HEART CATHETERIZATION WITH CORONARY ANGIOGRAM;  Surgeon: Marykay Lex, MD;  Location: [facility] CATH LAB;  Service: Cardiovascular::  mid LAD 70% & 95% after D1; OM1 70%, RCA: prox 60%, mid 95% with ectasia, distal mid 60%, then distal Cypeher stents going into RPAV - patent;; FFR of OM1 ~0.8 (borderline significant)   PERCUTANEOUS CORONARY STENT INTERVENTION (PCI-S) N/A 06/29/2012   Procedure: PERCUTANEOUS CORONARY STENT INTERVENTION (PCI-S);  Surgeon: Marykay Lex, MD;  Location: [facility] CATH LAB;  Service: Cardiovascular:: RCA - 2 overlapping Promus Premier DES 3.0 mm x 24 mm -- 3.5 mm x 18 mm (dilated prox to distal to: 3.88 -- 4.1, 4.25 - 3.68); LAD - Promus Premier DES 2.25 mm x 28 mm - dilated to 2.45 mm   PERCUTANEOUS CORONARY STENT INTERVENTION (PCI-S)  10/2005   "2";  Distal RCA: Cypher DES 3.0 mm x 13mm, 3.0 mm x 18 mm overlapping; (06/28/2012)    Immunization History  Administered Date(s) Administered   Influenza-Unspecified 11/22/2012, 11/22/2013, 12/07/2019   PFIZER(Purple  Top)SARS-COV-2 Vaccination 04/29/2019, 05/29/2019, 12/18/2019   Td 01/11/2008    MEDICATIONS/ALLERGIES   Current Meds  Medication Sig   aspirin EC 81 MG tablet Take 1 tablet (81 mg total) by mouth daily.   atorvastatin (LIPITOR) 40 MG tablet TAKE 1 TABLET (40 MG TOTAL) BY MOUTH DAILY. KEEP OV.   metoprolol succinate (TOPROL-XL) 25 MG 24 hr tablet TAKE 1 TABLET BY MOUTH EVERY DAY   ramipril (ALTACE) 10 MG capsule Take 1 capsule (10 mg total) by mouth daily. KEEP OV.   [DISCONTINUED] nitroGLYCERIN (NITROSTAT) 0.4 MG SL tablet Place 1 tablet (0.4 mg total) under the tongue every 5 (five) minutes as needed for chest pain.   [DISCONTINUED] rivaroxaban (XARELTO) 2.5 MG TABS tablet TAKE 1 TABLET BY MOUTH TWICE A DAY    Allergies  Allergen Reactions   Simvastatin Other (See Comments)    Dizzy, spaced out    SOCIAL HISTORY/FAMILY HISTORY   Reviewed in Epic:  Pertinent findings:  Social History   Tobacco Use   Smoking status: Former    Packs/day: 1.00    Years: 15.00    Pack years: 15.00    Types: Cigarettes    Quit date: 02/23/1992    Years since quitting: 28.9   Smokeless tobacco: Never  Substance Use Topics   Alcohol use: Yes    Alcohol/week: 6.0 standard drinks    Types: 3 Glasses of wine, 3 Cans of beer per week    Comment: 06/28/2012 "couple glass of wine or beer maybe 3X/wk"   Drug use: No   Social History   Social History Narrative   Caliber is happily married.  Does not have any children.   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  He retired in January 2022.   Is taking advantage of having for membership credentials at the [employer] and swimming pool.   Still exercises regularly working out at the gym and walking mostly 4 to 5 days a week and is outside doing yard work and other activities the other days a week.      With family heritage from [place], he has enjoyed going back to visit, and has a trip pending.    OBJCTIVE  -  PE, EKG, labs   Wt Readings from Last 3 Encounters:  12/15/20 148 lb 9.6 oz (67.4 kg)  07/02/20 148 lb 6.4 oz (67.3 kg)  05/07/20 154 lb 6.4 oz (70 kg)    Physical Exam: BP 138/80   Pulse 67   Ht 5\' 5"  (1.651 m)   Wt 148 lb 9.6 oz (67.4 kg)   SpO2 99%   BMI 24.73 kg/m  Physical Exam Vitals reviewed.  Constitutional:      General: He is not in acute distress.    Appearance: Normal appearance. He is normal weight. He is not ill-appearing or toxic-appearing.  HENT:     Head: Normocephalic and atraumatic.  Neck:     Vascular: No hepatojugular reflux or JVD.  Cardiovascular:     Rate and Rhythm: Normal rate and regular rhythm.     Chest Wall: PMI is not displaced.     Pulses: Normal pulses.     Heart sounds: S1 normal and S2 normal. No murmur heard.   No friction rub. No gallop.  Pulmonary:     Effort: Pulmonary effort is normal. No respiratory distress.     Breath sounds: Normal breath sounds. No wheezing, rhonchi or rales.  Chest:     Chest wall: No tenderness.  Musculoskeletal:        General: No swelling. Normal range of motion.     Cervical back: Normal range of motion and neck supple.  Skin:    General: Skin is warm and dry.  Neurological:     General: No focal deficit present.     Mental Status: He is alert and oriented to person, place, and time. Mental status is at baseline.  Psychiatric:        Mood and Affect: Mood normal.        Thought Content: Thought content normal.        Judgment: Judgment normal.     Adult ECG Report  Rate: 67 ;  Rhythm: normal sinus rhythm and normal axis, intervals & duration ;   Narrative Interpretation: stable  Recent Labs:  reviewed.   Lab Results  Component Value Date   CHOL 157 06/24/2020   HDL 67 06/24/2020   LDLCALC 72 06/24/2020   TRIG 99 06/24/2020   CHOLHDL 2.3 06/24/2020   Lab Results  Component Value Date   CREATININE 0.80 06/24/2020   BUN 13 06/24/2020   NA 138 06/24/2020   K 4.7 06/24/2020   CL 99  06/24/2020   CO2 21 06/24/2020   CBC Latest Ref Rng & Units 07/04/2019 06/30/2012 06/29/2012  WBC 3.4 - 10.8 x10E3/uL 7.6 9.4 8.4  Hemoglobin 13.0 - 17.7 g/dL 08/29/2012 29.4 76.5  Hematocrit 37.5 - 51.0 % 45.5 42.0 42.3  Platelets 150 - 450 x10E3/uL 161 132(L) 138(L)    Lab Results  Component Value Date   HGBA1C 5.4 07/02/2020   Lab Results  Component Value Date   TSH 0.931 06/24/2020    ==================================================  COVID-19 Education: The signs and symptoms of COVID-19 were discussed with the patient and how to seek care for testing (follow up with PCP or arrange E-visit).    I spent a total of 20 minutes with the patient spent in direct patient consultation.  Additional time spent with chart review  / charting (studies, outside notes, etc): 12 min Total Time: 32 none min  Current medicines are reviewed at length with the patient today.  (+/- concerns) none  This visit occurred during the SARS-CoV-2 public health  emergency.  Safety protocols were in place, including screening questions prior to the visit, additional usage of staff PPE, and extensive cleaning of exam room while observing appropriate contact time as indicated for disinfecting solutions.  Notice: This dictation was prepared with Dragon dictation along with smart phrase technology. Any transcriptional errors that result from this process are unintentional and may not be corrected upon review.  Patient Instructions / Medication Changes & Studies & Tests Ordered   Patient Instructions  Medication Instructions:   No changes *If you need a refill on your cardiac medications before your next appointment, please call your pharmacy*   Lab Work:  Not needed   Testing/Procedures: Not needed   Follow-Up: At [facility], you and your health needs are our priority.  As part of our continuing mission to provide you with exceptional heart care, we have created designated Provider Care Teams.  These  Care Teams include your primary Cardiologist (physician) and Advanced Practice Providers (APPs -  Physician Assistants and Nurse Practitioners) who all work together to provide you with the care you need, when you need it.     Your next appointment:   12 month(s)  The format for your next appointment:   In Person  Provider:   Bryan Lemma, MD     Studies Ordered:   Orders Placed This Encounter  Procedures   EKG 12-Lead      Bryan Lemma, M.D., M.S. Interventional Cardiologist   Pager # [phone] Phone # [phone] [address] [place], [place] [address]   Thank you for choosing Heartcare at [facility]!!

## 2020-12-15 NOTE — Patient Instructions (Signed)

## 2021-01-11 ENCOUNTER — Encounter: Payer: Self-pay | Admitting: Cardiology

## 2021-01-11 NOTE — Assessment & Plan Note (Signed)
He has had 2 separate ACS occasions, one was a non-STEMI the second was just crescendo angina.  Now has total of 4 overlapping stents in the RCA as well as 1 stent in the mid LAD.  After each occasion, he is remained angina free.  He is still doing well and exercise with no active symptoms.  Plan:  On low-dose Toprol, stable dose ramipril, and moderate-dose atorvastatin  He was converted from Thienopyridine to aspirin plus Xarelto and low-dose for maintenance  Okay to hold Xarelto 2 to 3 days preop for surgeries or procedures.   Also okay to hold aspirin 5 days preop

## 2021-01-11 NOTE — Assessment & Plan Note (Signed)
He had a history of inferior non-STEMI back in 2007 with RCA PCI followed by ACS unstable angina in 2014 with additional stents placed to the RCA as well as LAD.   No recurrent angina or CHF symptoms.  Preserved EF on echo.[name]

## 2021-01-11 NOTE — Assessment & Plan Note (Signed)
He has had some high blood pressure readings in the past, clearly has whitecoat syndrome issues based on his home recordings are.  He remains on low-dose Toprol along with 10 mg ramipril.  I do not think we should increase the beta-blocker because of issues with bradycardia in past.  Could potentially consider diuretic if additional blood pressure is required.

## 2021-01-11 NOTE — Assessment & Plan Note (Signed)
He had been on long-term Thienopyridine because of his extensive PCI.  This was converted to low-dose Xarelto (2.5 mg twice daily) plus aspirin for maintenance therapy.  Less bleeding and bruising, and less cost prohibitive.   Okay to hold stroke 2 to 3 days preop for surgical procedure,  Okay to hold aspirin 5 days preop for surgery or procedure.

## 2021-01-11 NOTE — Assessment & Plan Note (Signed)
Most recent labs have his LDL being 72 which is up from before.  He would like to avoid additional medications, therefore she can make conscious effort to readjust his diet.  If not at goal follow-up, would probably need to be more aggressive with statin and potentially convert to rosuvastatin from atorvastatin.

## 2021-01-16 ENCOUNTER — Telehealth: Payer: BC Managed Care – PPO | Admitting: Nurse Practitioner

## 2021-01-16 NOTE — Progress Notes (Signed)
No show

## 2021-02-01 ENCOUNTER — Other Ambulatory Visit: Payer: Self-pay | Admitting: Cardiology

## 2021-03-09 ENCOUNTER — Telehealth: Payer: Self-pay | Admitting: Cardiology

## 2021-03-09 MED ORDER — METOPROLOL SUCCINATE ER 25 MG PO TB24: 25.0000 mg | ORAL_TABLET | Freq: Every day | ORAL | 3 refills | Status: DC

## 2021-03-09 NOTE — Telephone Encounter (Signed)
°*  STAT* If patient is at the pharmacy, call can be transferred to refill team.   1. Which medications need to be refilled? (please list name of each medication and dose if known)  metoprolol succinate (TOPROL-XL) 25 MG 24 hr tablet  2. Which pharmacy/location (including street and city if local pharmacy) is medication to be sent to? [id number] - [place], [place] - [address]  3. Do they need a 30 day or 90 day supply? 90 with refills   Patient is out of medication

## 2021-07-28 ENCOUNTER — Encounter: Payer: Self-pay | Admitting: *Deleted

## 2021-11-09 ENCOUNTER — Other Ambulatory Visit: Payer: Self-pay | Admitting: Cardiology

## 2022-01-19 ENCOUNTER — Telehealth: Payer: Self-pay

## 2022-01-19 NOTE — Telephone Encounter (Signed)
...     Pre-operative Risk Assessment    Patient Name: [name]  DOB: August 15, 1955 MRN: [id number]      Request for Surgical Clearance    Procedure:   ACIOL EXPLANT/DSEK STAGE PPV/Y ON LEFT EYE  Date of Surgery:  Clearance 01/27/22                                 Surgeon:  DR Ebony Cargo WISELY Surgeon's Group or Practice Name:  [facility] Phone number:  [phone] Fax number:  [phone]   Type of Clearance Requested:   - Medical  - Pharmacy:  Hold Aspirin and Rivaroxaban (Xarelto)     Type of Anesthesia:  General    Additional requests/questions:    Jola Babinski   01/19/2022, 12:21 PM

## 2022-01-20 NOTE — Telephone Encounter (Signed)
Left message to call back for IN OFFICE appt for pre op clearance. Pt can see APP for pre op unless you can find a sooner appt with Dr. Herbie Baltimore.

## 2022-01-20 NOTE — Telephone Encounter (Signed)
   Name: MAXAMUS COLAO  DOB: 1955-07-18  MRN: [id number]  Primary Cardiologist: Bryan Lemma, MD  Chart reviewed as part of pre-operative protocol coverage. Because of MARQUE RADEMAKER past medical history and time since last visit, he will require a follow-up in-office visit in order to better assess preoperative cardiovascular risk.  Pt on low dose Xarelto for CAD per Dr. Herbie Baltimore "Okay to hold Xarelto 2 to 3 days preop for surgeries or procedures. Okay to hold aspirin 5 days prop for surgery or procedure"  Pre-op covering staff: - Please schedule appointment and call patient to inform them. If patient already had an upcoming appointment within acceptable timeframe, please add "pre-op clearance" to the appointment notes so provider is aware. - Please contact requesting surgeon's office via preferred method (i.e, phone, fax) to inform them of need for appointment prior to surgery.   Alver Sorrow, NP  01/20/2022, 9:43 AM

## 2022-01-21 NOTE — Telephone Encounter (Signed)
Pt called back today, scheduled OV for 01/22/22 @8 :00am with , NP

## 2022-01-21 NOTE — Progress Notes (Signed)
Office Visit    Patient Name: [name] Date of Encounter: 01/22/2022  Primary Care Provider:  Lind Covert, MD Primary Cardiologist:  Glenetta Hew, MD  Chief Complaint    66 year old male with a history of CAD s/p DES-RCA (x 4 overlapping), DES-LAD, hypertension, and hyperlipidemia who presents for follow-up related to CAD and for preoperative cardiac evaluation.   Past Medical History    Past Medical History:  Diagnosis Date   CAD S/P percutaneous coronary angioplasty 10/2005, 06/2012   a) Inf NSTEMI - distal RCA 90% - PCI; 60% rPDA -- PCI dRCA-PDA: 2 overlapping Cypher DES 3.0 x 13, 3.0 x 18;; b) Crescendo Angina - Abn Myoview with Inf Ischemia --> mLAD 70%-95% (after D1) - PCI w/ Promus P. DES 2.25 x 28 (2.45 mm); p-mRCA 60%, 95%-60%--> PCI w/ 2 overlapping Promus P DES 3.0 x 24 & 3.5 x 18 (dilated p-d: 3.88 - 4.1 - 4.23 - 3.68); OM1 - FFR 0.80 ( Med Rx)   HTN (hypertension)    Hx of non-ST elevation myocardial infarction (NSTEMI) 10/2005   Inf MI - distal RCA 90% - PCI; 60% rPDA; LCA minimal disease; Normal EF without WMA   Hyperlipidemia LDL goal <70    Past Surgical History:  Procedure Laterality Date   CATARACT EXTRACTION W/ INTRAOCULAR LENS  IMPLANT, BILATERAL Bilateral 1991-  1992   LEFT HEART CATHETERIZATION WITH CORONARY ANGIOGRAM N/A 06/28/2012   Procedure: LEFT HEART CATHETERIZATION WITH CORONARY ANGIOGRAM;  Surgeon: Leonie Man, MD;  Location: [facility] CATH LAB;  Service: Cardiovascular::  mid LAD 70% & 95% after D1; OM1 70%, RCA: prox 60%, mid 95% with ectasia, distal mid 60%, then distal Cypeher stents going into RPAV - patent;; FFR of OM1 ~0.8 (borderline significant)   PERCUTANEOUS CORONARY STENT INTERVENTION (PCI-S) N/A 06/29/2012   Procedure: PERCUTANEOUS CORONARY STENT INTERVENTION (PCI-S);  Surgeon: Leonie Man, MD;  Location: [facility] CATH LAB;  Service: Cardiovascular:: RCA - 2 overlapping Promus Premier DES 3.0 mm x 24 mm -- 3.5 mm x 18 mm (dilated  prox to distal to: 3.88 -- 4.1, 4.25 - 3.68); LAD - Promus Premier DES 2.25 mm x 28 mm - dilated to 2.45 mm   PERCUTANEOUS CORONARY STENT INTERVENTION (PCI-S)  10/2005   "2";  Distal RCA: Cypher DES 3.0 mm x 9mm, 3.0 mm x 18 mm overlapping; (06/28/2012)    Allergies  Allergies  Allergen Reactions   Simvastatin Other (See Comments)    Dizzy, spaced out    History of Present Illness    66 year old male with the above past medical history including CAD s/p DES-RCA (x 4 overlapping), DES-LAD, hypertension, and hyperlipidemia.  He has a history of NSTEMI in 2007 with [place] followed by ACS and unstable angina in 2014 with additional stents placed to RCA (x4 overlapping in total) as well as the LAD.  He is on aspirin and Xarelto given history of extensive PCI.  He was last seen in the office on 12/15/2020 and was stable from a cardiac standpoint.  He denied symptoms concerning for angina.  He presents today for follow-up and for preoperative cardiac evaluation for ACIOL explant/DSAEK stage PPV/Y on left I on 01/27/2022 with Dr. Jake [facility] of [facility].  Since his last visit he has been stable from a cardiac standpoint.  He denies any symptoms concerning for angina.  He notes his BP has been somewhat elevated over the past few months.  Otherwise, he reports feeling well.  Home Medications  Current Outpatient Medications  Medication Sig Dispense Refill   aspirin EC 81 MG tablet Take 1 tablet (81 mg total) by mouth daily. 90 tablet 3   atorvastatin (LIPITOR) 40 MG tablet Take 1 tablet (40 mg total) by mouth daily. SCHEDULE OFFICE VISIT FOR FUTURE REFILLS. 30 tablet 0   metoprolol succinate (TOPROL-XL) 25 MG 24 hr tablet Take 1 tablet (25 mg total) by mouth daily. 90 tablet 3   nitroGLYCERIN (NITROSTAT) 0.4 MG SL tablet Place 1 tablet (0.4 mg total) under the tongue every 5 (five) minutes as needed for chest pain. 25 tablet 4   ramipril (ALTACE) 10 MG capsule Take 2 capsules (20 mg  total) by mouth daily. 60 capsule 3   rivaroxaban (XARELTO) 2.5 MG TABS tablet Take 1 tablet (2.5 mg total) by mouth 2 (two) times daily. 180 tablet 3   No current facility-administered medications for this visit.     Review of Systems    He denies chest pain, palpitations, dyspnea, pnd, orthopnea, n, v, dizziness, syncope, edema, weight gain, or early satiety. All other systems reviewed and are otherwise negative except as noted above.   Physical Exam    VS:  BP (!) 150/86   Pulse (!) 55   Ht 5\' 5"  (1.651 m)   Wt 153 lb 6.4 oz (69.6 kg)   SpO2 98%   BMI 25.53 kg/m   GEN: Well nourished, well developed, in no acute distress. HEENT: normal. Neck: Supple, no JVD, carotid bruits, or masses. Cardiac: RRR, no murmurs, rubs, or gallops. No clubbing, cyanosis, edema.  Radials/DP/PT 2+ and equal bilaterally.  Respiratory:  Respirations regular and unlabored, clear to auscultation bilaterally. GI: Soft, nontender, nondistended, BS + x 4. MS: no deformity or atrophy. Skin: warm and dry, no rash. Neuro:  Strength and sensation are intact. Psych: Normal affect.  Accessory Clinical Findings    ECG personally reviewed by me today -bradycardia, 55 bpm- no acute changes.   Lab Results  Component Value Date   WBC 7.6 07/04/2019   HGB 15.5 07/04/2019   HCT 45.5 07/04/2019   MCV 98 (H) 07/04/2019   PLT 161 07/04/2019   Lab Results  Component Value Date   CREATININE 0.80 06/24/2020   BUN 13 06/24/2020   NA 138 06/24/2020   K 4.7 06/24/2020   CL 99 06/24/2020   CO2 21 06/24/2020   Lab Results  Component Value Date   ALT 37 06/24/2020   AST 33 06/24/2020   ALKPHOS 70 06/24/2020   BILITOT 0.6 06/24/2020   Lab Results  Component Value Date   CHOL 157 06/24/2020   HDL 67 06/24/2020   LDLCALC 72 06/24/2020   TRIG 99 06/24/2020   CHOLHDL 2.3 06/24/2020    Lab Results  Component Value Date   HGBA1C 5.4 07/02/2020    Assessment & Plan    1. CAD: S/p DES x 4 overlapping-RCA  and DES-LAD.  Denies symptoms concerning for angina.  No indication for ischemic evaluation.  Continue aspirin, Xarelto, metoprolol, ramipril, and Lipitor.  2. Hypertension: BP elevated above goal in office today.  He states he has noted elevated BP at home over the past several months.  Will increase ramipril to 20 mg daily.  Continue to monitor BP and report BP consistently above 130/80.  3. Hyperlipidemia: LDL was 72 in 06/2020.  Monitored and managed per PCP.  Continue aspirin, Lipitor.  4. Preoperative cardiac exam: According to the Revised Cardiac Risk Index (RCRI), his Perioperative Risk of Major  Cardiac Event is (%): 0.9. His Functional Capacity in METs is: 8.97 according to the Duke Activity Status Index (DASI). Therefore, based on ACC/AHA guidelines, patient would be at acceptable risk for the planned procedure without further cardiovascular testing.  Per Dr. Herbie Baltimore, he may hold Xarelto for 2 to 3 days prior to surgery. He may hold aspirin for 5 days prior to surgery.  Please resume Xarelto on aspirin as soon as possible postprocedure, the discretion of the surgeon. I will route this recommendation to the requesting party via Epic fax function.  5. Disposition: Follow-up in 1 month.  HYPERTENSION CONTROL Vitals:   01/22/22 0809 01/22/22 0830  BP: (!) 148/84 (!) 150/86    The patient's blood pressure is elevated above target today.  In order to address the patient's elevated BP: A current anti-hypertensive medication was adjusted today.; Blood pressure will be monitored at home to determine if medication changes need to be made.; Follow up with general cardiology has been recommended.      Joylene Grapes, NP 01/22/2022, 9:58 AM

## 2022-01-22 ENCOUNTER — Other Ambulatory Visit: Payer: Self-pay | Admitting: Cardiology

## 2022-01-22 ENCOUNTER — Encounter: Payer: Self-pay | Admitting: Nurse Practitioner

## 2022-01-22 ENCOUNTER — Ambulatory Visit: Payer: BC Managed Care – PPO | Attending: Nurse Practitioner | Admitting: Nurse Practitioner

## 2022-01-22 VITALS — BP 150/86 | HR 55 | Ht 65.0 in | Wt 153.4 lb

## 2022-01-22 DIAGNOSIS — I10 Essential (primary) hypertension: Secondary | ICD-10-CM

## 2022-01-22 DIAGNOSIS — E785 Hyperlipidemia, unspecified: Secondary | ICD-10-CM

## 2022-01-22 DIAGNOSIS — I2510 Atherosclerotic heart disease of native coronary artery without angina pectoris: Secondary | ICD-10-CM | POA: Diagnosis not present

## 2022-01-22 DIAGNOSIS — Z9861 Coronary angioplasty status: Secondary | ICD-10-CM

## 2022-01-22 DIAGNOSIS — Z01810 Encounter for preprocedural cardiovascular examination: Secondary | ICD-10-CM

## 2022-01-22 MED ORDER — RAMIPRIL 10 MG PO CAPS: 20.0000 mg | ORAL_CAPSULE | Freq: Every day | ORAL | 3 refills | Status: DC

## 2022-01-22 NOTE — Telephone Encounter (Signed)
[name] with [facility] is following up. She is requesting to have the clearance sent to fax# [phone]. She states the other fax number does not work. She also states procedure is scheduled for 01/27/22, not 02/27/22.

## 2022-01-22 NOTE — Patient Instructions (Signed)
Medication Instructions:  Increase Ramipril 20 mg daily  *If you need a refill on your cardiac medications before your next appointment, please call your pharmacy*   Lab Work: NONE ordered at this time of appointment   If you have labs (blood work) drawn today and your tests are completely normal, you will receive your results only by: MyChart Message (if you have MyChart) OR A paper copy in the mail If you have any lab test that is abnormal or we need to change your treatment, we will call you to review the results.   Testing/Procedures: NONE ordered at this time of appointment     Follow-Up: At [facility], you and your health needs are our priority.  As part of our continuing mission to provide you with exceptional heart care, we have created designated Provider Care Teams.  These Care Teams include your primary Cardiologist (physician) and Advanced Practice Providers (APPs -  Physician Assistants and Nurse Practitioners) who all work together to provide you with the care you need, when you need it.  We recommend signing up for the patient portal called "MyChart".  Sign up information is provided on this After Visit Summary.  MyChart is used to connect with patients for Virtual Visits (Telemedicine).  Patients are able to view lab/test results, encounter notes, upcoming appointments, etc.  Non-urgent messages can be sent to your provider as well.   To learn more about what you can do with MyChart, go to [email or web address].    Your next appointment:   1 month(s)  The format for your next appointment:   In Person  Provider:   Bernadene Person, NP        Other Instructions Hold Xarelto 2-3 days prior to procedure. Hold Asprin 5 days prior to procedure. Monitor Blood pressure. Goal is 130/80. [place] Blood pressure cuff recommended.   Important Information About Sugar

## 2022-01-22 NOTE — Telephone Encounter (Signed)
Prescription refill request for Xarelto received.  Indication: CAD Last office visit: 01/22/22 ([name])  Weight: 69.9kg Age: 66 Scr: 0.80 (06/24/20) CrCl:  91.73ml/min  Labs overdue. Pt has upcoming appt on 02/25/21 with Bernadene Person. Note placed on appt to have labs drawn. Refill sent.

## 2022-01-22 NOTE — Telephone Encounter (Signed)
Pt was seen today by Bernadene Person, NP. I will fax notes to requesting office.

## 2022-01-25 ENCOUNTER — Other Ambulatory Visit: Payer: Self-pay | Admitting: Nurse Practitioner

## 2022-01-25 ENCOUNTER — Other Ambulatory Visit: Payer: Self-pay | Admitting: Cardiology

## 2022-01-26 ENCOUNTER — Other Ambulatory Visit: Payer: Self-pay | Admitting: Cardiology

## 2022-01-26 ENCOUNTER — Other Ambulatory Visit: Payer: Self-pay

## 2022-01-26 ENCOUNTER — Telehealth: Payer: Self-pay | Admitting: Cardiology

## 2022-01-26 DIAGNOSIS — Z79899 Other long term (current) drug therapy: Secondary | ICD-10-CM

## 2022-01-26 NOTE — Telephone Encounter (Signed)
  Toni Amend is calling back to f/u. She said she needs the clearance today before 11 am because she needs to submit it to the OR. She gave fax# [phone]

## 2022-01-26 NOTE — Telephone Encounter (Signed)
Conferred with PharmD regarding Xarelto 2.5mg  refill. [name] ordered BMET and CBC before refill. Patient advised. 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 He has enough Xarelto to that point. Orders placed.

## 2022-01-26 NOTE — Telephone Encounter (Signed)
Pt c/o medication issue:  1. Name of Medication:      rivaroxaban (XARELTO) 2.5 MG TABS tablet    2. How are you currently taking this medication (dosage and times per day)?   3. Are you having a reaction (difficulty breathing--STAT)?   4. What is your medication issue? Pt has still not received his refill, he states he spoke to the pharmacy and they do not have it. Notes say pt needs labs before future refills, however I do not see any active request. Please advise.

## 2022-01-26 NOTE — Telephone Encounter (Signed)
I WILL RE-FAX THE CLEARANCE NOTES AGAIN. TODAY WE WERE GIVEN A DIFFERENT FAX NUMBER THAN WHAT WAS ON THE CLEARANCE REQUEST. OUR OFFICE HAS FAXED CLEARED X 2, TODAY WILL BE X 3. PLEASE SEE NOTES FROM SURGEON OFFICE NEED CLEARANCE BEFORE 11 AM, THOUGH WE HAVE BEEN FAXING CLEARANCE WITH CONFIRMATION PAGE ON OUR END THAT FAX HAS GONE THROUGH.  OUR PRACTICE STRIVES ON CARING FOR OUR PATIENTS TO THE VERY BEST OF QUALITY OF CARE.

## 2022-02-20 ENCOUNTER — Other Ambulatory Visit: Payer: Self-pay | Admitting: Cardiology

## 2022-02-25 ENCOUNTER — Ambulatory Visit: Payer: BC Managed Care – PPO | Admitting: Nurse Practitioner

## 2022-03-15 ENCOUNTER — Other Ambulatory Visit: Payer: Self-pay | Admitting: Cardiology

## 2022-03-16 NOTE — Telephone Encounter (Signed)
Prescription refill request for Xarelto received.  Indication: CAD Last office visit: 01/22/22  Carmelia Roller NP Weight: 69.6kg Age: 67 Scr: 0.82 06/24/20 CrCl:  Based on above findings Xarelto 2.5mg  twice daily is the appropriate dose.  Refill approved. Appt 03/25/22.  Labs Requested

## 2022-03-22 ENCOUNTER — Ambulatory Visit: Payer: BC Managed Care – PPO | Admitting: Cardiology

## 2022-03-25 ENCOUNTER — Other Ambulatory Visit: Payer: Self-pay

## 2022-03-25 ENCOUNTER — Ambulatory Visit: Payer: BC Managed Care – PPO | Attending: Nurse Practitioner | Admitting: Nurse Practitioner

## 2022-03-25 ENCOUNTER — Encounter: Payer: Self-pay | Admitting: Nurse Practitioner

## 2022-03-25 VITALS — BP 170/86 | HR 56 | Ht 65.0 in | Wt 155.6 lb

## 2022-03-25 DIAGNOSIS — I2510 Atherosclerotic heart disease of native coronary artery without angina pectoris: Secondary | ICD-10-CM | POA: Diagnosis not present

## 2022-03-25 DIAGNOSIS — I10 Essential (primary) hypertension: Secondary | ICD-10-CM | POA: Diagnosis not present

## 2022-03-25 DIAGNOSIS — E785 Hyperlipidemia, unspecified: Secondary | ICD-10-CM | POA: Diagnosis not present

## 2022-03-25 DIAGNOSIS — Z955 Presence of coronary angioplasty implant and graft: Secondary | ICD-10-CM | POA: Diagnosis not present

## 2022-03-25 MED ORDER — CARVEDILOL 6.25 MG PO TABS: 6.2500 mg | ORAL_TABLET | Freq: Two times a day (BID) | ORAL | 3 refills | Status: DC

## 2022-03-25 MED ORDER — OLMESARTAN MEDOXOMIL 40 MG PO TABS: 40.0000 mg | ORAL_TABLET | Freq: Every day | ORAL | 3 refills | Status: DC

## 2022-03-25 NOTE — Progress Notes (Signed)
Office Visit    Patient Name: [name] Date of Encounter: 03/25/2022  Primary Care Provider:  Lind Covert, MD Primary Cardiologist:  Glenetta Hew, MD  Chief Complaint    67 year old male with a history of CAD s/p DES-RCA (x 4 overlapping), DES-LAD, hypertension, and hyperlipidemia who presents for follow-up related to CAD and hypertension.   Past Medical History    Past Medical History:  Diagnosis Date   CAD S/P percutaneous coronary angioplasty 10/2005, 06/2012   a) Inf NSTEMI - distal RCA 90% - PCI; 60% rPDA -- PCI dRCA-PDA: 2 overlapping Cypher DES 3.0 x 13, 3.0 x 18;; b) Crescendo Angina - Abn Myoview with Inf Ischemia --> mLAD 70%-95% (after D1) - PCI w/ Promus P. DES 2.25 x 28 (2.45 mm); p-mRCA 60%, 95%-60%--> PCI w/ 2 overlapping Promus P DES 3.0 x 24 & 3.5 x 18 (dilated p-d: 3.88 - 4.1 - 4.23 - 3.68); OM1 - FFR 0.80 ( Med Rx)   HTN (hypertension)    Hx of non-ST elevation myocardial infarction (NSTEMI) 10/2005   Inf MI - distal RCA 90% - PCI; 60% rPDA; LCA minimal disease; Normal EF without WMA   Hyperlipidemia LDL goal <70    Past Surgical History:  Procedure Laterality Date   CATARACT EXTRACTION W/ INTRAOCULAR LENS  IMPLANT, BILATERAL Bilateral 1991-  1992   LEFT HEART CATHETERIZATION WITH CORONARY ANGIOGRAM N/A 06/28/2012   Procedure: LEFT HEART CATHETERIZATION WITH CORONARY ANGIOGRAM;  Surgeon: Leonie Man, MD;  Location: [facility] CATH LAB;  Service: Cardiovascular::  mid LAD 70% & 95% after D1; OM1 70%, RCA: prox 60%, mid 95% with ectasia, distal mid 60%, then distal Cypeher stents going into RPAV - patent;; FFR of OM1 ~0.8 (borderline significant)   PERCUTANEOUS CORONARY STENT INTERVENTION (PCI-S) N/A 06/29/2012   Procedure: PERCUTANEOUS CORONARY STENT INTERVENTION (PCI-S);  Surgeon: Leonie Man, MD;  Location: [facility] CATH LAB;  Service: Cardiovascular:: RCA - 2 overlapping Promus Premier DES 3.0 mm x 24 mm -- 3.5 mm x 18 mm (dilated prox to distal to: 3.88 --  4.1, 4.25 - 3.68); LAD - Promus Premier DES 2.25 mm x 28 mm - dilated to 2.45 mm   PERCUTANEOUS CORONARY STENT INTERVENTION (PCI-S)  10/2005   "2";  Distal RCA: Cypher DES 3.0 mm x 51mm, 3.0 mm x 18 mm overlapping; (06/28/2012)    Allergies  No Active Allergies    Labs/Other Studies Reviewed    The following studies were reviewed today: Stress test 06/13/2012:  Impression Exercise Capacity:  Excellent exercise capacity. BP Response:  Hypertensive blood pressure response. Clinical Symptoms:  Mild chest pain/dyspnea. Fatigue ECG Impression:  Insignificant upsloping ST segment depression. LV Wall Motion:  NL LV Function; NL Wall Motion, but poor thickening in the inferior-inferoseptal walls.   Comparison with Prior Nuclear Study: The reversible perfusion defect was not present on the previous study from 2008.   Overall Impression:  High risk stress nuclear study.  Significant area of probable ischemia in the RCA distribution.  Recommend cardiac catheterization.   Leonie Man, MD Recent Labs: No results found for requested labs within last 365 days.  Recent Lipid Panel    Component Value Date/Time   CHOL 157 06/24/2020 0957   CHOL 183 12/26/2013 0823   TRIG 99 06/24/2020 0957   TRIG 72 12/26/2013 0823   HDL 67 06/24/2020 0957   HDL 68 12/26/2013 0823   CHOLHDL 2.3 06/24/2020 0957   CHOLHDL 2.6 06/25/2015 0852   VLDL 20  06/25/2015 0852   [place] 72 06/24/2020 0957   LDLCALC 101 (H) 12/26/2013 0277    History of Present Illness    67 year old male with the above past medical history including CAD s/p DES-RCA (x 4 overlapping), DES-LAD, hypertension, and hyperlipidemia.   He has a history of NSTEMI in 2007 with [place] followed by ACS and unstable angina in 2014 with additional stents placed to RCA (x4 overlapping in total) as well as the LAD.  He is on aspirin and Xarelto given history of extensive PCI.  He was last seen in the office on 01/22/2022 and was stable from a  cardiac standpoint. HIs BP was elevated and ramipril was increased to 20 mg daily.   He presents today for follow-up.  Since his last visit he has been stable from a cardiac standpoint.  He remains active and continues to exercise regularly.  He denies symptoms concerning for angina.  Unfortunately, his BP remains elevated above goal despite increased ramipril dose.  He brought his home BP cuff today which appears consistent with manual readings.  He is concerned about his ongoing elevated blood pressure, otherwise, he reports feeling well.  Home Medications    Current Outpatient Medications  Medication Sig Dispense Refill   aspirin EC 81 MG tablet Take 1 tablet (81 mg total) by mouth daily. 90 tablet 3   atorvastatin (LIPITOR) 40 MG tablet TAKE 1 TABLET (40 MG TOTAL) BY MOUTH DAILY. SCHEDULE OFFICE VISIT FOR FUTURE REFILLS. 90 tablet 3   carvedilol (COREG) 6.25 MG tablet Take 1 tablet (6.25 mg total) by mouth 2 (two) times daily. 180 tablet 3   nitroGLYCERIN (NITROSTAT) 0.4 MG SL tablet Place 1 tablet (0.4 mg total) under the tongue every 5 (five) minutes as needed for chest pain. 25 tablet 4   olmesartan (BENICAR) 40 MG tablet Take 1 tablet (40 mg total) by mouth daily. 90 tablet 3   XARELTO 2.5 MG TABS tablet TAKE 1 TABLET BY MOUTH TWICE A DAY 180 tablet 0   No current facility-administered medications for this visit.     Review of Systems    He denies chest pain, palpitations, dyspnea, pnd, orthopnea, n, v, dizziness, syncope, edema, weight gain, or early satiety. All other systems reviewed and are otherwise negative except as noted above.   Physical Exam    VS:  BP (!) 170/86   Pulse (!) 56   Ht 5\' 5"  (1.651 m)   Wt 155 lb 9.6 oz (70.6 kg)   SpO2 97%   BMI 25.89 kg/m   GEN: Well nourished, well developed, in no acute distress. HEENT: normal. Neck: Supple, no JVD, carotid bruits, or masses. Cardiac: RRR, no murmurs, rubs, or gallops. No clubbing, cyanosis, edema.  Radials/DP/PT  2+ and equal bilaterally.  Respiratory:  Respirations regular and unlabored, clear to auscultation bilaterally. GI: Soft, nontender, nondistended, BS + x 4. MS: no deformity or atrophy. Skin: warm and dry, no rash. Neuro:  Strength and sensation are intact. Psych: Normal affect.  Accessory Clinical Findings    ECG personally reviewed by me today - No EKG in office today.     Lab Results  Component Value Date   WBC 7.6 07/04/2019   HGB 15.5 07/04/2019   HCT 45.5 07/04/2019   MCV 98 (H) 07/04/2019   PLT 161 07/04/2019   Lab Results  Component Value Date   CREATININE 0.80 06/24/2020   BUN 13 06/24/2020   NA 138 06/24/2020   K 4.7 06/24/2020   CL  99 06/24/2020   CO2 21 06/24/2020   Lab Results  Component Value Date   ALT 37 06/24/2020   AST 33 06/24/2020   ALKPHOS 70 06/24/2020   BILITOT 0.6 06/24/2020   Lab Results  Component Value Date   CHOL 157 06/24/2020   HDL 67 06/24/2020   LDLCALC 72 06/24/2020   TRIG 99 06/24/2020   CHOLHDL 2.3 06/24/2020    Lab Results  Component Value Date   HGBA1C 5.4 07/02/2020    Assessment & Plan   1. Hypertension: BP remains elevated despite increased ramipril.  He has had SBP readings in the 140s to 150s, SBP in the 170s today.  Discussed with Pharm.D. who recommends transitioning from ramipril and metoprolol to Olmesartan and carvedilol.  Will stop ramipril and metoprolol.  Will start olmesartan 40 mg daily.  Will start carvedilol 6.25 mg twice daily.  Continue to monitor BP and report BP consistently greater than 130/80.  Will repeat BMET in 2 weeks. Continue to monitor BP and report BP consistently above 130/80.  2. CAD: S/p DES x 4 overlapping-RCA and DES-LAD.  Denies symptoms concerning for angina.  No indication for ischemic evaluation.  Continue aspirin, Xarelto, carvedilol, olmesartan, and Lipitor.  Will check CBC, CMET today given Xarelto.  3. Hyperlipidemia: LDL was 72 in 06/2020.  Will repeat fasting lipid panel, CMET  today.  Continue aspirin, Lipitor.   4. Disposition: Follow-up in 2-3 months.   HYPERTENSION CONTROL Vitals:   03/25/22 0753 03/25/22 0839  BP: (!) 208/90 (!) 170/86    The patient's blood pressure is elevated above target today.  In order to address the patient's elevated BP: Blood pressure will be monitored at home to determine if medication changes need to be made.; A new medication was prescribed today.; Follow up with general cardiology has been recommended.     Lenna Sciara, NP 03/25/2022, 4:34 PM

## 2022-03-25 NOTE — Patient Instructions (Signed)
Medication Instructions:  Stop Ramipril as directed Stop Metoprolol as directed Start Olmesartan 40 mg daily Start  Carvedilol 6.25 mg twice daily  *If you need a refill on your cardiac medications before your next appointment, please call your pharmacy*   Lab Work: Your physician recommends that you complete lab work today and return in 2 weeks for lab work. CBC, CMET, Fasting Lipid Panel (today) BMET (2 weeks)  If you have labs (blood work) drawn today and your tests are completely normal, you will receive your results only by: [place] (if you have MyChart) OR A paper copy in the mail If you have any lab test that is abnormal or we need to change your treatment, we will call you to review the results.   Testing/Procedures: NONE ordered at this time of appointment     Follow-Up: At [facility], you and your health needs are our priority.  As part of our continuing mission to provide you with exceptional heart care, we have created designated Provider Care Teams.  These Care Teams include your primary Cardiologist (physician) and Advanced Practice Providers (APPs -  Physician Assistants and Nurse Practitioners) who all work together to provide you with the care you need, when you need it.  We recommend signing up for the patient portal called "MyChart".  Sign up information is provided on this After Visit Summary.  MyChart is used to connect with patients for Virtual Visits (Telemedicine).  Patients are able to view lab/test results, encounter notes, upcoming appointments, etc.  Non-urgent messages can be sent to your provider as well.   To learn more about what you can do with MyChart, go to [email or web address].    Your next appointment:   2-3 month(s)  Provider:   Diona Browner, NP        Other Instructions

## 2022-03-26 LAB — CBC
Hematocrit: 46.6 % (ref 37.5–51.0)
Hemoglobin: 15.7 g/dL (ref 13.0–17.7)
MCH: 32.4 pg (ref 26.6–33.0)
MCHC: 33.7 g/dL (ref 31.5–35.7)
MCV: 96 fL (ref 79–97)
Platelets: 171 10*3/uL (ref 150–450)
RBC: 4.84 x10E6/uL (ref 4.14–5.80)
RDW: 12.7 % (ref 11.6–15.4)
WBC: 8 10*3/uL (ref 3.4–10.8)

## 2022-03-26 LAB — COMPREHENSIVE METABOLIC PANEL
ALT: 37 IU/L (ref 0–44)
AST: 36 IU/L (ref 0–40)
Albumin/Globulin Ratio: 2.1 (ref 1.2–2.2)
Albumin: 4.7 g/dL (ref 3.9–4.9)
Alkaline Phosphatase: 82 IU/L (ref 44–121)
BUN/Creatinine Ratio: 15 (ref 10–24)
BUN: 12 mg/dL (ref 8–27)
Bilirubin Total: 0.9 mg/dL (ref 0.0–1.2)
CO2: 25 mmol/L (ref 20–29)
Calcium: 9.6 mg/dL (ref 8.6–10.2)
Chloride: 103 mmol/L (ref 96–106)
Creatinine, Ser: 0.8 mg/dL (ref 0.76–1.27)
Globulin, Total: 2.2 g/dL (ref 1.5–4.5)
Glucose: 114 mg/dL — ABNORMAL HIGH (ref 70–99)
Potassium: 5 mmol/L (ref 3.5–5.2)
Sodium: 140 mmol/L (ref 134–144)
Total Protein: 6.9 g/dL (ref 6.0–8.5)
eGFR: 99 mL/min/{1.73_m2} (ref >59)

## 2022-03-26 LAB — LIPID PANEL
Chol/HDL Ratio: 2.7 ratio (ref 0.0–5.0)
Cholesterol, Total: 176 mg/dL (ref 100–199)
HDL: 66 mg/dL (ref >39)
LDL Chol Calc (NIH): 89 mg/dL (ref 0–99)
Triglycerides: 121 mg/dL (ref 0–149)
VLDL Cholesterol Cal: 21 mg/dL (ref 5–40)

## 2022-05-25 ENCOUNTER — Ambulatory Visit: Payer: BC Managed Care – PPO | Admitting: Nurse Practitioner

## 2022-09-01 ENCOUNTER — Other Ambulatory Visit: Payer: Self-pay | Admitting: Cardiology

## 2022-09-01 NOTE — Telephone Encounter (Signed)
Prescription refill request for Eliquis received. Indication: CAD Last office visit: 03/25/22  E [name] NP Scr: 0.80 on 03/25/22  Epic Age: 67 Weight: 70.6kg  Based on above findings Eliquis 2.5mg  twice daily is the appropriate dose.  Refill approved.

## 2022-11-19 NOTE — Telephone Encounter (Signed)
Usually the surgeon would call and ask this but I think it makes a lot of sense to do so.

## 2022-12-14 ENCOUNTER — Other Ambulatory Visit: Payer: Self-pay | Admitting: Pharmacist

## 2022-12-14 ENCOUNTER — Telehealth: Payer: Self-pay | Admitting: Cardiology

## 2022-12-14 DIAGNOSIS — I2510 Atherosclerotic heart disease of native coronary artery without angina pectoris: Secondary | ICD-10-CM

## 2022-12-14 DIAGNOSIS — I252 Old myocardial infarction: Secondary | ICD-10-CM

## 2022-12-14 DIAGNOSIS — E785 Hyperlipidemia, unspecified: Secondary | ICD-10-CM

## 2022-12-14 MED ORDER — ATORVASTATIN CALCIUM 40 MG PO TABS: 40.0000 mg | ORAL_TABLET | Freq: Every day | ORAL | 3 refills | Status: DC

## 2022-12-14 MED ORDER — CARVEDILOL 6.25 MG PO TABS
6.2500 mg | ORAL_TABLET | Freq: Two times a day (BID) | ORAL | 3 refills | Status: DC
Start: 2022-12-14 — End: 2023-12-08

## 2022-12-14 MED ORDER — NITROGLYCERIN 0.4 MG SL SUBL: 0.4000 mg | SUBLINGUAL_TABLET | SUBLINGUAL | 4 refills | Status: AC | PRN

## 2022-12-14 MED ORDER — XARELTO 2.5 MG PO TABS: 2.5000 mg | ORAL_TABLET | Freq: Two times a day (BID) | ORAL | 1 refills | Status: DC

## 2022-12-14 MED ORDER — OLMESARTAN MEDOXOMIL 40 MG PO TABS: 40.0000 mg | ORAL_TABLET | Freq: Every day | ORAL | 3 refills | Status: DC

## 2022-12-14 NOTE — Telephone Encounter (Signed)
Refills complete 

## 2022-12-14 NOTE — Telephone Encounter (Signed)
Pt c/o medication issue:  1. Name of Medication:   All medication  2. How are you currently taking this medication (dosage and times per day)?   3. Are you having a reaction (difficulty breathing--STAT)?   4. What is your medication issue?   Caller [name]) stated patient wants all of his medications transferred to  [facility] [id number] - [place], [place] - [address]  Caller stated patient is starting to run out of medication.

## 2022-12-14 NOTE — Telephone Encounter (Signed)
Forwarding to anticoag team for Xarelto refill and refills team for the rest of pt's refills.

## 2022-12-14 NOTE — Telephone Encounter (Signed)
Patient called to confirm he wants his prescriptions sent to the [facility] on [address].  Patient noted he is now out of medications.  Patient stated he is also taking metoprolol succinate (TOPROL-XL) 25 MG 24 hr tablet [[id number]] but does not show on his current list of medications.  Patient wants a call back to confirm prescriptions have been transferred.

## 2022-12-14 NOTE — Telephone Encounter (Signed)
Spoke to patient and made him aware he was supposed to stop Metoprolol 03/25/22. Pt stated that he did remember being told to stop medication therefore he was still taking it along with the Carvedilol.  Pt stated his previous pharmacy closed and he is having trouble getting them to transfer his  Xarelto to [facility]. Would like new prescription sent in.    Pt c/o medication issue:   1. Name of Medication:   All medication   2. How are you currently taking this medication (dosage and times per day)?    3. Are you having a reaction (difficulty breathing--STAT)?    4. What is your medication issue?    Caller Almeta Monas) stated patient wants all of his medications transferred to  [facility] [id number] - [place], [place] - [address]  Caller stated patient is starting to run out of medication.

## 2022-12-16 ENCOUNTER — Other Ambulatory Visit (HOSPITAL_COMMUNITY): Payer: Self-pay

## 2022-12-16 ENCOUNTER — Telehealth: Payer: Self-pay | Admitting: Cardiology

## 2022-12-16 ENCOUNTER — Other Ambulatory Visit: Payer: Self-pay

## 2022-12-16 ENCOUNTER — Ambulatory Visit: Payer: Medicare PPO | Admitting: Student

## 2022-12-16 VITALS — BP 157/77 | HR 62 | Ht 65.0 in | Wt 152.5 lb

## 2022-12-16 DIAGNOSIS — I252 Old myocardial infarction: Secondary | ICD-10-CM

## 2022-12-16 DIAGNOSIS — I2510 Atherosclerotic heart disease of native coronary artery without angina pectoris: Secondary | ICD-10-CM

## 2022-12-16 DIAGNOSIS — R051 Acute cough: Secondary | ICD-10-CM

## 2022-12-16 DIAGNOSIS — E785 Hyperlipidemia, unspecified: Secondary | ICD-10-CM

## 2022-12-16 MED ORDER — FLUTICASONE PROPIONATE 50 MCG/ACT NA SUSP: 2.0000 | Freq: Every day | NASAL | 6 refills | Status: DC

## 2022-12-16 MED ORDER — XARELTO 2.5 MG PO TABS
2.5000 mg | ORAL_TABLET | Freq: Two times a day (BID) | ORAL | 1 refills | Status: DC
Start: 2022-12-16 — End: 2023-05-30
  Filled 2022-12-16: qty 60, 30d supply, fill #0
  Filled 2023-01-05 – 2023-01-10 (×3): qty 60, 30d supply, fill #1
  Filled 2023-02-08: qty 60, 30d supply, fill #2
  Filled 2023-03-06: qty 60, 30d supply, fill #3
  Filled 2023-04-05: qty 60, 30d supply, fill #4
  Filled 2023-05-05 – 2023-05-07 (×2): qty 60, 30d supply, fill #5

## 2022-12-16 NOTE — Telephone Encounter (Signed)
Referred pt to [facility]. Pt agreed. Called [facility] and confirmed dose is in stock. Refill sent over.

## 2022-12-16 NOTE — Progress Notes (Addendum)
    SUBJECTIVE:   CHIEF COMPLAINT / HPI: Cough  Coughing up green stuff About 1 week Wife has been sick as well and has had prolonged cough No fevers and has been checking with thermometer Not felt SOB No hemoptysis No vomiting or diarrhea Mucinex has been taking as needed Feels that it is worse at nighttime when laying down Quit smoking 30 years ago states that he was never a heavy smoker No hx of COPD or asthma  PERTINENT  PMH / PSH: HTN, history of NSTEMI 2007 with PCI in RCA, ACS and unstable angina in 2014, additional stents to RCA x 4 overlapping and LAD currently on aspirin and Xarelto due to extensive PCI, s/p   OBJECTIVE:   BP (!) 157/77   Pulse 62   Ht 5\' 5"  (1.651 m)   Wt 152 lb 8 oz (69.2 kg)   SpO2 100%   BMI 25.38 kg/m   General: NAD, awake, alert, responsive to questions Head: Normocephalic, significant nasal congestion present bilaterally, oropharynx without exudates, TM and canal clear bilaterally CV: Regular rate and rhythm  Respiratory: Mild intermittent crackles throughout lung fields, chest rises symmetrically,  no increased work of breathing on room air Abdomen: Soft, non-tender, non-distended Extremities: Moves upper and lower extremities freely, no edema in LE  ASSESSMENT/PLAN:   Assessment & Plan Acute cough Has been ongoing for 1 week.  Likely in the setting of viral infection.  He is overall improving but is understandably irritated by cough.  Likely component of postnasal drip.  Less likely bacterial infection given well appearance, no fevers. -Flonase Rx -Warm fluids with honey recommended -ED/return precautions discussed and placed in AVS   HTN Elevated today. States he has white coat syndrome but does not have ambulatory BP monitor on chart that I see. -Recommend 1 month follow up, consider ambulatory BP monitor  Levin Erp, MD [facility]

## 2022-12-16 NOTE — Telephone Encounter (Signed)
Pt called in stating his pharmacy is currently out of stock on Xarelto. He asked what his other options are. Please advise and he is completely out of medication.

## 2022-12-16 NOTE — Patient Instructions (Addendum)
It was great to see you! Thank you for allowing me to participate in your care!   Our plans for today:  -I am prescribing Flonase to do daily to help with your postnasal drip and cough -warm fluids with honey -mucinex -This cough may linger for up to 6 to 8 weeks, if continuing after 8 weeks please come back to care.  If having blood in your sputum, chest pain, fevers please return to care  Take care and seek immediate care sooner if you develop any concerns.  Levin Erp, MD

## 2023-01-05 ENCOUNTER — Other Ambulatory Visit (HOSPITAL_COMMUNITY): Payer: Self-pay

## 2023-01-07 ENCOUNTER — Other Ambulatory Visit: Payer: Self-pay

## 2023-01-10 ENCOUNTER — Other Ambulatory Visit (HOSPITAL_COMMUNITY): Payer: Self-pay

## 2023-01-26 DIAGNOSIS — Z947 Corneal transplant status: Secondary | ICD-10-CM | POA: Diagnosis not present

## 2023-01-26 DIAGNOSIS — T868412 Corneal transplant failure, left eye: Secondary | ICD-10-CM | POA: Diagnosis not present

## 2023-01-26 DIAGNOSIS — H18232 Secondary corneal edema, left eye: Secondary | ICD-10-CM | POA: Diagnosis not present

## 2023-02-09 ENCOUNTER — Other Ambulatory Visit (HOSPITAL_COMMUNITY): Payer: Self-pay

## 2023-03-07 ENCOUNTER — Other Ambulatory Visit (HOSPITAL_COMMUNITY): Payer: Self-pay

## 2023-04-27 DIAGNOSIS — H40052 Ocular hypertension, left eye: Secondary | ICD-10-CM | POA: Diagnosis not present

## 2023-04-27 DIAGNOSIS — H18232 Secondary corneal edema, left eye: Secondary | ICD-10-CM | POA: Diagnosis not present

## 2023-04-27 DIAGNOSIS — Z947 Corneal transplant status: Secondary | ICD-10-CM | POA: Diagnosis not present

## 2023-04-27 DIAGNOSIS — T868412 Corneal transplant failure, left eye: Secondary | ICD-10-CM | POA: Diagnosis not present

## 2023-05-05 DIAGNOSIS — H4052X4 Glaucoma secondary to other eye disorders, left eye, indeterminate stage: Secondary | ICD-10-CM | POA: Diagnosis not present

## 2023-05-05 DIAGNOSIS — H18232 Secondary corneal edema, left eye: Secondary | ICD-10-CM | POA: Diagnosis not present

## 2023-05-07 ENCOUNTER — Other Ambulatory Visit (HOSPITAL_COMMUNITY): Payer: Self-pay

## 2023-05-30 ENCOUNTER — Other Ambulatory Visit: Payer: Self-pay | Admitting: Cardiology

## 2023-05-30 DIAGNOSIS — E785 Hyperlipidemia, unspecified: Secondary | ICD-10-CM

## 2023-05-30 DIAGNOSIS — I252 Old myocardial infarction: Secondary | ICD-10-CM

## 2023-05-30 DIAGNOSIS — I2510 Atherosclerotic heart disease of native coronary artery without angina pectoris: Secondary | ICD-10-CM

## 2023-05-30 NOTE — Telephone Encounter (Signed)
 Returned call and transferred call to scheduling.

## 2023-05-30 NOTE — Telephone Encounter (Signed)
 Prescription refill request for Xarelto received.  Indication: CAD Last office visit: 03/25/22 ([name])  Weight: 69.2kg Age: 68 Scr: 0.8 (03/25/22) CrCl:   Office visit and labs overdue. Called pt, no answer. Left message on voicemail.

## 2023-05-31 ENCOUNTER — Other Ambulatory Visit (HOSPITAL_COMMUNITY): Payer: Self-pay

## 2023-05-31 ENCOUNTER — Telehealth: Payer: Self-pay | Admitting: Cardiology

## 2023-05-31 MED ORDER — XARELTO 2.5 MG PO TABS
2.5000 mg | ORAL_TABLET | Freq: Two times a day (BID) | ORAL | 1 refills | Status: DC
  Filled 2023-05-31 – 2023-06-01 (×2): qty 180, 90d supply, fill #0
  Filled 2023-08-29 – 2023-08-30 (×2): qty 180, 90d supply, fill #1

## 2023-05-31 NOTE — Telephone Encounter (Signed)
 Xarelto 2.5mg  refill request received. Pt is 68 years old, weight-69.2kg, Crea-0.80 on 03/25/22-needs updated labs, last seen by Bernadene Person on 03/25/22 and has upcoming appt on 07/12/23, Diagnosis-CAD, CrCl- 90.1 mL/min; Dose is appropriate based OV note for CAD.  Refill was sent today bu another using for 3 month supply and a refill.  A note was placed by another user on upcoming appt for labs.

## 2023-05-31 NOTE — Telephone Encounter (Signed)
*  STAT* If patient is at the pharmacy, call can be transferred to refill team.   1. Which medications need to be refilled? (please list name of each medication and dose if known) rivaroxaban (XARELTO) 2.5 MG TABS tablet   2. Which pharmacy/location (including street and city if local pharmacy) is medication to be sent to? [facility] [id number] [place], [place] - [address]   3. Do they need a 30 day or 90 day supply? 90   Patient has appt on 5/20

## 2023-06-01 ENCOUNTER — Other Ambulatory Visit (HOSPITAL_COMMUNITY): Payer: Self-pay

## 2023-06-02 ENCOUNTER — Other Ambulatory Visit (HOSPITAL_COMMUNITY): Payer: Self-pay

## 2023-06-02 DIAGNOSIS — Z947 Corneal transplant status: Secondary | ICD-10-CM | POA: Diagnosis not present

## 2023-06-02 DIAGNOSIS — H18232 Secondary corneal edema, left eye: Secondary | ICD-10-CM | POA: Diagnosis not present

## 2023-06-02 DIAGNOSIS — H4423 Degenerative myopia, bilateral: Secondary | ICD-10-CM | POA: Diagnosis not present

## 2023-06-02 DIAGNOSIS — H35372 Puckering of macula, left eye: Secondary | ICD-10-CM | POA: Diagnosis not present

## 2023-06-02 DIAGNOSIS — H35351 Cystoid macular degeneration, right eye: Secondary | ICD-10-CM | POA: Diagnosis not present

## 2023-06-02 DIAGNOSIS — H4052X4 Glaucoma secondary to other eye disorders, left eye, indeterminate stage: Secondary | ICD-10-CM | POA: Diagnosis not present

## 2023-06-30 DIAGNOSIS — H4052X4 Glaucoma secondary to other eye disorders, left eye, indeterminate stage: Secondary | ICD-10-CM | POA: Diagnosis not present

## 2023-07-12 ENCOUNTER — Ambulatory Visit: Attending: Nurse Practitioner | Admitting: Nurse Practitioner

## 2023-07-12 ENCOUNTER — Encounter: Payer: Self-pay | Admitting: Nurse Practitioner

## 2023-07-12 VITALS — BP 120/70 | HR 54 | Ht 65.0 in | Wt 157.2 lb

## 2023-07-12 DIAGNOSIS — E785 Hyperlipidemia, unspecified: Secondary | ICD-10-CM

## 2023-07-12 DIAGNOSIS — R001 Bradycardia, unspecified: Secondary | ICD-10-CM

## 2023-07-12 DIAGNOSIS — I2510 Atherosclerotic heart disease of native coronary artery without angina pectoris: Secondary | ICD-10-CM | POA: Diagnosis not present

## 2023-07-12 DIAGNOSIS — I10 Essential (primary) hypertension: Secondary | ICD-10-CM

## 2023-07-12 NOTE — Patient Instructions (Signed)
 Medication Instructions:  Your physician recommends that you continue on your current medications as directed. Please refer to the Current Medication list given to you today.  *If you need a refill on your cardiac medications before your next appointment, please call your pharmacy*  Lab Work: Lipid panel, CMET, CBC at your convenience.   Testing/Procedures: NONE ordered at this time of appointment   Follow-Up: At [facility], you and your health needs are our priority.  As part of our continuing mission to provide you with exceptional heart care, our providers are all part of one team.  This team includes your primary Cardiologist (physician) and Advanced Practice Providers or APPs (Physician Assistants and Nurse Practitioners) who all work together to provide you with the care you need, when you need it.  Your next appointment:   1 year(s)  Provider:   Randene Bustard, MD    We recommend signing up for the patient portal called "MyChart".  Sign up information is provided on this After Visit Summary.  MyChart is used to connect with patients for Virtual Visits (Telemedicine).  Patients are able to view lab/test results, encounter notes, upcoming appointments, etc.  Non-urgent messages can be sent to your provider as well.   To learn more about what you can do with MyChart, go to [email or web address].

## 2023-07-12 NOTE — Progress Notes (Signed)
 Office Visit    Patient Name: [name] Date of Encounter: 07/12/2023  Primary Care Provider:  Wilhemena Harbour, MD Primary Cardiologist:  Randene Bustard, MD  Chief Complaint    68 year old male with a history of CAD s/p DES-RCA (x 4 overlapping), DES-LAD, hypertension, and hyperlipidemia who presents for follow-up related to CAD and hypertension.    Past Medical History    Past Medical History:  Diagnosis Date   CAD S/P percutaneous coronary angioplasty 10/2005, 06/2012   a) Inf NSTEMI - distal RCA 90% - PCI; 60% rPDA -- PCI dRCA-PDA: 2 overlapping Cypher DES 3.0 x 13, 3.0 x 18;; b) Crescendo Angina - Abn Myoview with Inf Ischemia --> mLAD 70%-95% (after D1) - PCI w/ Promus P. DES 2.25 x 28 (2.45 mm); p-mRCA 60%, 95%-60%--> PCI w/ 2 overlapping Promus P DES 3.0 x 24 & 3.5 x 18 (dilated p-d: 3.88 - 4.1 - 4.23 - 3.68); OM1 - FFR 0.80 ( Med Rx)   HTN (hypertension)    Hx of non-ST elevation myocardial infarction (NSTEMI) 10/2005   Inf MI - distal RCA 90% - PCI; 60% rPDA; LCA minimal disease; Normal EF without WMA   Hyperlipidemia LDL goal <70    Past Surgical History:  Procedure Laterality Date   CATARACT EXTRACTION W/ INTRAOCULAR LENS  IMPLANT, BILATERAL Bilateral 1991-  1992   LEFT HEART CATHETERIZATION WITH CORONARY ANGIOGRAM N/A 06/28/2012   Procedure: LEFT HEART CATHETERIZATION WITH CORONARY ANGIOGRAM;  Surgeon: Arleen Lacer, MD;  Location: [facility] CATH LAB;  Service: Cardiovascular::  mid LAD 70% & 95% after D1; OM1 70%, RCA: prox 60%, mid 95% with ectasia, distal mid 60%, then distal Cypeher stents going into RPAV - patent;; FFR of OM1 ~0.8 (borderline significant)   PERCUTANEOUS CORONARY STENT INTERVENTION (PCI-S) N/A 06/29/2012   Procedure: PERCUTANEOUS CORONARY STENT INTERVENTION (PCI-S);  Surgeon: Arleen Lacer, MD;  Location: [facility] CATH LAB;  Service: Cardiovascular:: RCA - 2 overlapping Promus Premier DES 3.0 mm x 24 mm -- 3.5 mm x 18 mm (dilated prox to distal to: 3.88 -- 4.1,  4.25 - 3.68); LAD - Promus Premier DES 2.25 mm x 28 mm - dilated to 2.45 mm   PERCUTANEOUS CORONARY STENT INTERVENTION (PCI-S)  10/2005   "2";  Distal RCA: Cypher DES 3.0 mm x 13mm, 3.0 mm x 18 mm overlapping; (06/28/2012)    Allergies  No Active Allergies   Labs/Other Studies Reviewed    The following studies were reviewed today:     Recent Labs: No results found for requested labs within last 365 days.  Recent Lipid Panel    Component Value Date/Time   CHOL 176 03/25/2022 0847   CHOL 183 12/26/2013 0823   TRIG 121 03/25/2022 0847   TRIG 72 12/26/2013 0823   HDL 66 03/25/2022 0847   HDL 68 12/26/2013 0823   CHOLHDL 2.7 03/25/2022 0847   CHOLHDL 2.6 06/25/2015 0852   VLDL 20 06/25/2015 0852   LDLCALC 89 03/25/2022 0847   LDLCALC 101 (H) 12/26/2013 0823    History of Present Illness    68 year old male with the above past medical history including CAD s/p DES-RCA (x 4 overlapping), DES-LAD, hypertension, and hyperlipidemia.   He has a history of NSTEMI in 2007 with PCI-RCA followed by ACS and unstable angina in 2014 with additional stents placed to RCA (x4 overlapping in total) as well as the LAD.  He is on aspirin  and Xarelto  given history of extensive PCI.  He was last seen  in the office on 03/25/2022 and was stable from a cardiac standpoint.  BP was elevated.  He was transitioned from ramipril  to olmesartan  and from metoprolol  to carvedilol .  He presents today for follow-up.  Since his last visit he has been stable from a cardiac standpoint.  He exercises regularly at the gym, he walks 3 to 4 miles at a time,  exercises on the elliptical.  He denies symptoms concerning for angina.  BP has been well-controlled. Since his last visit has had multiple surgeries for glaucoma and is pending repeat surgery in the near future. Overall, he reports feeling well.   Home Medications    Current Outpatient Medications  Medication Sig Dispense Refill   acetaZOLAMIDE ER (DIAMOX) 500 MG  capsule Take 1 capsule by mouth 2 (two) times daily.     aspirin  EC 81 MG tablet Take 1 tablet (81 mg total) by mouth daily. 90 tablet 3   atorvastatin  (LIPITOR) 40 MG tablet Take 1 tablet (40 mg total) by mouth daily. SCHEDULE OFFICE VISIT FOR FUTURE REFILLS. 90 tablet 3   brimonidine (ALPHAGAN) 0.2 % ophthalmic solution Place 1 drop into the left eye daily.     carvedilol  (COREG ) 6.25 MG tablet Take 1 tablet (6.25 mg total) by mouth 2 (two) times daily. 180 tablet 3   dorzolamide-timolol (COSOPT) 2-0.5 % ophthalmic solution Place 1 drop into the left eye 3 (three) times daily.     fluticasone  (FLONASE ) 50 MCG/ACT nasal spray Place 2 sprays into both nostrils daily. 16 g 6   latanoprost (XALATAN) 0.005 % ophthalmic solution Place 1 drop into the left eye in the morning and at bedtime.     metoprolol  succinate (TOPROL -XL) 25 MG 24 hr tablet Take 1 tablet by mouth every morning.     nitroGLYCERIN  (NITROSTAT ) 0.4 MG SL tablet Place 1 tablet (0.4 mg total) under the tongue every 5 (five) minutes as needed for chest pain. 25 tablet 4   olmesartan  (BENICAR ) 40 MG tablet Take 1 tablet (40 mg total) by mouth daily. 90 tablet 3   prednisoLONE acetate (PRED FORTE) 1 % ophthalmic suspension Place 1 drop into the left eye daily.     rivaroxaban  (XARELTO ) 2.5 MG TABS tablet Take 1 tablet (2.5 mg total) by mouth 2 (two) times daily. 180 tablet 1   No current facility-administered medications for this visit.     Review of Systems    He denies chest pain, palpitations, dyspnea, pnd, orthopnea, n, v, dizziness, syncope, edema, weight gain, or early satiety. All other systems reviewed and are otherwise negative except as noted above.   Physical Exam    VS:  BP 120/70 (BP Location: Left Arm, Patient Position: Sitting, Cuff Size: Normal)   Pulse (!) 54   Ht 5\' 5"  (1.651 m)   Wt 157 lb 3.2 oz (71.3 kg)   SpO2 100%   BMI 26.16 kg/m   GEN: Well nourished, well developed, in no acute distress. HEENT:  normal. Neck: Supple, no JVD, carotid bruits, or masses. Cardiac: RRR, no murmurs, rubs, or gallops. No clubbing, cyanosis, edema.  Radials/DP/PT 2+ and equal bilaterally.  Respiratory:  Respirations regular and unlabored, clear to auscultation bilaterally. GI: Soft, nontender, nondistended, BS + x 4. MS: no deformity or atrophy. Skin: warm and dry, no rash. Neuro:  Strength and sensation are intact. Psych: Normal affect.  Accessory Clinical Findings    ECG personally reviewed by me today - EKG Interpretation Date/Time:  Tuesday Jul 12 2023 15:30:57 EDT Ventricular  Rate:  54 PR Interval:  148 QRS Duration:  88 QT Interval:  410 QTC Calculation: 388 R Axis:   133  Text Interpretation: Sinus bradycardia Left posterior fascicular block Possible Anterior infarct , age undetermined When compared with ECG of 30-Jun-2012 05:10, No significant change since last tracing Confirmed by Marlana Silvan ([id number]) on 07/12/2023 3:44:27 PM  - no acute changes.   Lab Results  Component Value Date   WBC 8.0 03/25/2022   HGB 15.7 03/25/2022   HCT 46.6 03/25/2022   MCV 96 03/25/2022   PLT 171 03/25/2022   Lab Results  Component Value Date   CREATININE 0.80 03/25/2022   BUN 12 03/25/2022   NA 140 03/25/2022   K 5.0 03/25/2022   CL 103 03/25/2022   CO2 25 03/25/2022   Lab Results  Component Value Date   ALT 37 03/25/2022   AST 36 03/25/2022   ALKPHOS 82 03/25/2022   BILITOT 0.9 03/25/2022   Lab Results  Component Value Date   CHOL 176 03/25/2022   HDL 66 03/25/2022   LDLCALC 89 03/25/2022   TRIG 121 03/25/2022   CHOLHDL 2.7 03/25/2022    Lab Results  Component Value Date   HGBA1C 5.4 07/02/2020    Assessment & Plan    1. CAD: S/p DES x 4 overlapping-RCA and DES-LAD.  Denies symptoms concerning for angina.  No indication for ischemic evaluation.  Continue aspirin , Xarelto , carvedilol , olmesartan , and Lipitor.  Will check CBC, CMET today given Xarelto .  2. Bradycardia: EKG shows  sinus bradycardia, he is asymptomatic. Continue metoprolol .   3. Hypertension: BP well controlled. Continue current antihypertensive regimen.    4. Hyperlipidemia: LDL was 89 in 03/2022.  Will repeat fasting lipids, CMET.  If LDL remains elevated above goal, consider escalation of statin therapy.  Continue aspirin , Lipitor.   4. Disposition: Follow-up in 1 year.      Jude Norton, NP 07/12/2023, 3:56 PM

## 2023-07-13 DIAGNOSIS — H4052X4 Glaucoma secondary to other eye disorders, left eye, indeterminate stage: Secondary | ICD-10-CM | POA: Diagnosis not present

## 2023-07-13 DIAGNOSIS — Z947 Corneal transplant status: Secondary | ICD-10-CM | POA: Diagnosis not present

## 2023-07-13 DIAGNOSIS — T868412 Corneal transplant failure, left eye: Secondary | ICD-10-CM | POA: Diagnosis not present

## 2023-07-13 DIAGNOSIS — H18232 Secondary corneal edema, left eye: Secondary | ICD-10-CM | POA: Diagnosis not present

## 2023-07-14 ENCOUNTER — Encounter: Payer: Self-pay | Admitting: Nurse Practitioner

## 2023-07-22 DIAGNOSIS — I2510 Atherosclerotic heart disease of native coronary artery without angina pectoris: Secondary | ICD-10-CM | POA: Diagnosis not present

## 2023-07-22 DIAGNOSIS — Z947 Corneal transplant status: Secondary | ICD-10-CM | POA: Diagnosis not present

## 2023-07-22 DIAGNOSIS — Z955 Presence of coronary angioplasty implant and graft: Secondary | ICD-10-CM | POA: Diagnosis not present

## 2023-07-22 DIAGNOSIS — I252 Old myocardial infarction: Secondary | ICD-10-CM | POA: Diagnosis not present

## 2023-07-22 DIAGNOSIS — Z888 Allergy status to other drugs, medicaments and biological substances status: Secondary | ICD-10-CM | POA: Diagnosis not present

## 2023-07-22 DIAGNOSIS — I10 Essential (primary) hypertension: Secondary | ICD-10-CM | POA: Diagnosis not present

## 2023-07-22 DIAGNOSIS — Z9889 Other specified postprocedural states: Secondary | ICD-10-CM | POA: Diagnosis not present

## 2023-07-22 DIAGNOSIS — Z7901 Long term (current) use of anticoagulants: Secondary | ICD-10-CM | POA: Diagnosis not present

## 2023-07-22 DIAGNOSIS — Z87891 Personal history of nicotine dependence: Secondary | ICD-10-CM | POA: Diagnosis not present

## 2023-07-22 DIAGNOSIS — H4052X4 Glaucoma secondary to other eye disorders, left eye, indeterminate stage: Secondary | ICD-10-CM | POA: Diagnosis not present

## 2023-08-02 ENCOUNTER — Encounter: Payer: Self-pay | Admitting: *Deleted

## 2023-08-24 DIAGNOSIS — I10 Essential (primary) hypertension: Secondary | ICD-10-CM | POA: Diagnosis not present

## 2023-08-24 DIAGNOSIS — E785 Hyperlipidemia, unspecified: Secondary | ICD-10-CM | POA: Diagnosis not present

## 2023-08-24 DIAGNOSIS — I2510 Atherosclerotic heart disease of native coronary artery without angina pectoris: Secondary | ICD-10-CM | POA: Diagnosis not present

## 2023-08-25 LAB — LIPID PANEL
Chol/HDL Ratio: 2.8 ratio (ref 0.0–5.0)
Cholesterol, Total: 147 mg/dL (ref 100–199)
HDL: 52 mg/dL (ref >39)
LDL Chol Calc (NIH): 71 mg/dL (ref 0–99)
Triglycerides: 135 mg/dL (ref 0–149)
VLDL Cholesterol Cal: 24 mg/dL (ref 5–40)

## 2023-08-25 LAB — COMPREHENSIVE METABOLIC PANEL WITH GFR
ALT: 29 IU/L (ref 0–44)
AST: 27 IU/L (ref 0–40)
Albumin: 4.3 g/dL (ref 3.9–4.9)
Alkaline Phosphatase: 93 IU/L (ref 44–121)
BUN/Creatinine Ratio: 14 (ref 10–24)
BUN: 11 mg/dL (ref 8–27)
Bilirubin Total: 0.6 mg/dL (ref 0.0–1.2)
CO2: 22 mmol/L (ref 20–29)
Calcium: 8.9 mg/dL (ref 8.6–10.2)
Chloride: 106 mmol/L (ref 96–106)
Creatinine, Ser: 0.76 mg/dL (ref 0.76–1.27)
Globulin, Total: 2 g/dL (ref 1.5–4.5)
Glucose: 101 mg/dL — ABNORMAL HIGH (ref 70–99)
Potassium: 4.6 mmol/L (ref 3.5–5.2)
Sodium: 141 mmol/L (ref 134–144)
Total Protein: 6.3 g/dL (ref 6.0–8.5)
eGFR: 99 mL/min/{1.73_m2} (ref >59)

## 2023-08-25 LAB — CBC
Hematocrit: 42.4 % (ref 37.5–51.0)
Hemoglobin: 14.1 g/dL (ref 13.0–17.7)
MCH: 32.6 pg (ref 26.6–33.0)
MCHC: 33.3 g/dL (ref 31.5–35.7)
MCV: 98 fL — ABNORMAL HIGH (ref 79–97)
Platelets: 145 10*3/uL — ABNORMAL LOW (ref 150–450)
RBC: 4.32 x10E6/uL (ref 4.14–5.80)
RDW: 12.9 % (ref 11.6–15.4)
WBC: 6.7 10*3/uL (ref 3.4–10.8)

## 2023-08-29 ENCOUNTER — Ambulatory Visit: Payer: Self-pay | Admitting: Nurse Practitioner

## 2023-08-29 DIAGNOSIS — I10 Essential (primary) hypertension: Secondary | ICD-10-CM

## 2023-08-29 DIAGNOSIS — E785 Hyperlipidemia, unspecified: Secondary | ICD-10-CM

## 2023-08-29 NOTE — Telephone Encounter (Signed)
 LMOM, to discuss lab results and recommendations. Waiting on a return call.

## 2023-08-30 ENCOUNTER — Other Ambulatory Visit: Payer: Self-pay

## 2023-08-30 ENCOUNTER — Other Ambulatory Visit (HOSPITAL_COMMUNITY): Payer: Self-pay

## 2023-08-30 DIAGNOSIS — I10 Essential (primary) hypertension: Secondary | ICD-10-CM

## 2023-08-30 DIAGNOSIS — E785 Hyperlipidemia, unspecified: Secondary | ICD-10-CM

## 2023-08-30 NOTE — Telephone Encounter (Signed)
 Patient returning call to nurse for lab results.

## 2023-08-30 NOTE — Addendum Note (Signed)
 Addended by: GEORGINA HILA on: 08/30/2023 08:34 AM   Modules accepted: Orders

## 2023-08-31 ENCOUNTER — Other Ambulatory Visit: Payer: Self-pay

## 2023-09-08 ENCOUNTER — Encounter (HOSPITAL_COMMUNITY): Payer: Self-pay

## 2023-09-08 ENCOUNTER — Ambulatory Visit (HOSPITAL_COMMUNITY)
Admission: EM | Admit: 2023-09-08 | Discharge: 2023-09-08 | Disposition: A | Discharge status: Home / Self Care | Attending: Family Medicine | Admitting: Family Medicine

## 2023-09-08 DIAGNOSIS — R1310 Dysphagia, unspecified: Secondary | ICD-10-CM

## 2023-09-08 NOTE — ED Triage Notes (Signed)
 Patient presenting with trouble swallowing and a flare of acid reflux onset Tuesday night. No dietary or medication changes. States his reflux will be triggered by acidic foods and he ate a lot of tomatoes on that day.  Prescriptions or OTC medications tried: Yes- Nexium, Vinegar, Tums    with little relief.

## 2023-09-08 NOTE — ED Provider Notes (Signed)
 [facility]   [id number] 09/08/23 Arrival Time: 1550  ASSESSMENT & PLAN:  1. Difficulty swallowing liquids    Over the past hour or so he is now tolerating sips of PO fluids. Without respiratory difficulty. Will take both Nexium and Pepcid.  Urgent GI referral placed. ED with any worsening; he voices understanding.  Orders Placed This Encounter  Procedures   Ambulatory referral to Gastroenterology    Referral Priority:   Urgent    Referral Type:   Consultation    Referral Reason:   Specialty Services Required    Number of Visits Requested:   1   Reviewed expectations re: course of current medical issues. Questions answered. Outlined signs and symptoms indicating need for more acute intervention. Patient verbalized understanding. After Visit Summary given.   SUBJECTIVE: History from: patient.  [name] is a 68 y.o. male who reports trouble swallowing solids and liquids x 48 hours; noted after eating tomatoes; similar symptoms in past but never this bad. Questions acid reflux related. Nexium, Vinegar, Tums PTA with little relief.  Denies resp difficulties/fever/rash.  Past Surgical History:  Procedure Laterality Date   CATARACT EXTRACTION W/ INTRAOCULAR LENS  IMPLANT, BILATERAL Bilateral 1991-  1992   LEFT HEART CATHETERIZATION WITH CORONARY ANGIOGRAM N/A 06/28/2012   Procedure: LEFT HEART CATHETERIZATION WITH CORONARY ANGIOGRAM;  Surgeon: Alm LELON Clay, MD;  Location: [facility] CATH LAB;  Service: Cardiovascular::  mid LAD 70% & 95% after D1; OM1 70%, RCA: prox 60%, mid 95% with ectasia, distal mid 60%, then distal Cypeher stents going into RPAV - patent;; FFR of OM1 ~0.8 (borderline significant)   PERCUTANEOUS CORONARY STENT INTERVENTION (PCI-S) N/A 06/29/2012   Procedure: PERCUTANEOUS CORONARY STENT INTERVENTION (PCI-S);  Surgeon: Alm LELON Clay, MD;  Location: [facility] CATH LAB;  Service: Cardiovascular:: RCA - 2 overlapping Promus Premier DES 3.0 mm x 24 mm -- 3.5 mm  x 18 mm (dilated prox to distal to: 3.88 -- 4.1, 4.25 - 3.68); LAD - Promus Premier DES 2.25 mm x 28 mm - dilated to 2.45 mm   PERCUTANEOUS CORONARY STENT INTERVENTION (PCI-S)  10/2005   2;  Distal RCA: Cypher DES 3.0 mm x 13mm, 3.0 mm x 18 mm overlapping; (06/28/2012)    OBJECTIVE:  Vitals:   09/08/23 1700  BP: (!) 151/74  Pulse: 75  Resp: 18  Temp: 98.3 F (36.8 C)  TempSrc: Oral  SpO2: 95%  Weight: 69.9 kg  Height: 5' 5 (1.651 m)    General appearance: alert; no distress Oropharynx: moist Neck: supple with FROM Lungs: unlabored Skin: warm; dry Psychological: alert and cooperative; normal mood and affect    No Known Allergies                                             Past Medical History:  Diagnosis Date   CAD S/P percutaneous coronary angioplasty 10/2005, 06/2012   a) Inf NSTEMI - distal RCA 90% - PCI; 60% rPDA -- PCI dRCA-PDA: 2 overlapping Cypher DES 3.0 x 13, 3.0 x 18;; b) Crescendo Angina - Abn Myoview with Inf Ischemia --> mLAD 70%-95% (after D1) - PCI w/ Promus P. DES 2.25 x 28 (2.45 mm); p-mRCA 60%, 95%-60%--> PCI w/ 2 overlapping Promus P DES 3.0 x 24 & 3.5 x 18 (dilated p-d: 3.88 - 4.1 - 4.23 - 3.68); OM1 - FFR 0.80 ( Med Rx)  HTN (hypertension)    Hx of non-ST elevation myocardial infarction (NSTEMI) 10/2005   Inf MI - distal RCA 90% - PCI; 60% rPDA; LCA minimal disease; Normal EF without WMA   Hyperlipidemia LDL goal <70    Social History   Socioeconomic History   Marital status: Married    Spouse name: Not on file   Number of children: 0   Years of education: Not on file   Highest education level: Some college, no degree  Occupational History   Occupation: Glass blower/designer: [employer]    Comment: Retired as of January 2022  Tobacco Use   Smoking status: Former    Current packs/day: 0.00    Average packs/day: 1 pack/day for 15.0 years (15.0 ttl pk-yrs)    Types: Cigarettes    Start date: 02/22/1977    Quit date: 02/23/1992    Years  since quitting: 31.5   Smokeless tobacco: Never  Substance and Sexual Activity   Alcohol use: Yes    Alcohol/week: 6.0 standard drinks of alcohol    Types: 3 Glasses of wine, 3 Cans of beer per week    Comment: 06/28/2012 couple glass of wine or beer maybe 3X/wk   Drug use: No   Sexual activity: Yes  Other Topics Concern   Not on file  Social History Narrative   [name] is happily married.  Does not have any children.   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  He retired in January 2022.   Is taking advantage of having for membership credentials at the [employer] and swimming pool.   Still exercises regularly working out at the gym and walking mostly 4 to 5 days a week and is outside doing yard work and other activities the other days a week.      With family heritage from [place], he has enjoyed going back to visit, and has a trip pending.   Social Drivers of Corporate investment banker Strain: Low Risk  (12/16/2022)   Overall Financial Resource Strain (CARDIA)    Difficulty of Paying Living Expenses: Not hard at all  Food Insecurity: No Food Insecurity (12/16/2022)   Hunger Vital Sign    Worried About Running Out of Food in the Last Year: Never true    Ran Out of Food in the Last Year: Never true  Transportation Needs: No Transportation Needs (12/16/2022)   PRAPARE - Administrator, Civil Service (Medical): No    Lack of Transportation (Non-Medical): No  Physical Activity: Sufficiently Active (12/16/2022)   Exercise Vital Sign    Days of Exercise per Week: 6 days    Minutes of Exercise per Session: 90 min  Stress: No Stress Concern Present (12/16/2022)   [employer] of Occupational Health - Occupational Stress Questionnaire    Feeling of Stress : Not at all  Social Connections: Socially Integrated (12/16/2022)   Social Connection and Isolation Panel    Frequency of Communication with Friends and Family: More than  three times a week    Frequency of Social Gatherings with Friends and Family: More than three times a week    Attends Religious Services: More than 4 times per year    Active Member of [employer] or Organizations: Yes    Attends Banker Meetings: Not on file    Marital Status: Married  Catering manager Violence: Not on file   Family History  Problem Relation Age  of Onset   Heart attack Father 29       died of acute mi   Coronary artery disease Brother 47       stents placed      Rolinda Rogue, MD 09/08/23 [phone]

## 2023-09-08 NOTE — Discharge Instructions (Signed)
 Take both Nexium and Pepcid as directed on the bottle until your follow up with GI.

## 2023-09-16 ENCOUNTER — Encounter: Payer: Self-pay | Admitting: Family Medicine

## 2023-11-26 ENCOUNTER — Other Ambulatory Visit: Payer: Self-pay | Admitting: Cardiology

## 2023-11-26 DIAGNOSIS — I252 Old myocardial infarction: Secondary | ICD-10-CM

## 2023-11-26 DIAGNOSIS — I2510 Atherosclerotic heart disease of native coronary artery without angina pectoris: Secondary | ICD-10-CM

## 2023-11-26 DIAGNOSIS — E785 Hyperlipidemia, unspecified: Secondary | ICD-10-CM

## 2023-11-28 ENCOUNTER — Other Ambulatory Visit: Payer: Self-pay

## 2023-11-28 ENCOUNTER — Other Ambulatory Visit (HOSPITAL_COMMUNITY): Payer: Self-pay

## 2023-11-28 MED ORDER — RIVAROXABAN 2.5 MG PO TABS
2.5000 mg | ORAL_TABLET | Freq: Two times a day (BID) | ORAL | 1 refills | Status: AC
  Filled 2023-11-28 (×2): qty 180, 90d supply, fill #0
  Filled 2024-02-23: qty 180, 90d supply, fill #1

## 2023-12-06 ENCOUNTER — Other Ambulatory Visit: Payer: Self-pay | Admitting: Cardiology

## 2023-12-06 DIAGNOSIS — E785 Hyperlipidemia, unspecified: Secondary | ICD-10-CM

## 2023-12-06 DIAGNOSIS — I252 Old myocardial infarction: Secondary | ICD-10-CM

## 2023-12-06 DIAGNOSIS — I2510 Atherosclerotic heart disease of native coronary artery without angina pectoris: Secondary | ICD-10-CM

## 2023-12-14 DIAGNOSIS — Z947 Corneal transplant status: Secondary | ICD-10-CM | POA: Diagnosis not present

## 2023-12-14 DIAGNOSIS — H18232 Secondary corneal edema, left eye: Secondary | ICD-10-CM | POA: Diagnosis not present

## 2023-12-14 DIAGNOSIS — T868412 Corneal transplant failure, left eye: Secondary | ICD-10-CM | POA: Diagnosis not present

## 2024-01-23 ENCOUNTER — Ambulatory Visit

## 2024-01-23 DIAGNOSIS — Z0000 Encounter for general adult medical examination without abnormal findings: Secondary | ICD-10-CM | POA: Diagnosis not present

## 2024-01-23 NOTE — Patient Instructions (Signed)
 [name],  Thank you for taking the time for your Medicare Wellness Visit. I appreciate your continued commitment to your health goals. Please review the care plan we discussed, and feel free to reach out if I can assist you further.  Please note that Annual Wellness Visits do not include a physical exam. Some assessments may be limited, especially if the visit was conducted virtually. If needed, we may recommend an in-person follow-up with your provider.  Ongoing Care Seeing your primary care provider every 3 to 6 months helps us  monitor your health and provide consistent, personalized care.   Referrals If a referral was made during today's visit and you haven't received any updates within two weeks, please contact the referred provider directly to check on the status.  Recommended Screenings:  Health Maintenance  Topic Date Due   Medicare Annual Wellness Visit  Never done   Pneumococcal Vaccine for age over 28 (1 of 2 - PCV) Never done   Zoster (Shingles) Vaccine (1 of 2) Never done   Colon Cancer Screening  Never done   DTaP/Tdap/Td vaccine (2 - Tdap) 01/10/2018   Flu Shot  09/23/2023   COVID-19 Vaccine (4 - 2025-26 season) 10/24/2023   Hepatitis C Screening  Completed   Meningitis B Vaccine  Aged Out       01/23/2024    3:44 PM  Advanced Directives  Does Patient Have a Medical Advance Directive? Yes  Type of Estate Agent of [place];Living will  Does patient want to make changes to medical advance directive? No - Patient declined  Copy of Healthcare Power of Attorney in Chart? No - copy requested    Vision: Annual vision screenings are recommended for early detection of glaucoma, cataracts, and diabetic retinopathy. These exams can also reveal signs of chronic conditions such as diabetes and high blood pressure.  Dental: Annual dental screenings help detect early signs of oral cancer, gum disease, and other conditions linked to overall health,  including heart disease and diabetes.  Please see the attached documents for additional preventive care recommendations.

## 2024-01-23 NOTE — Progress Notes (Cosign Needed)
 Chief Complaint  Patient presents with   Medicare Wellness     Subjective:   [name] is a 68 y.o. male who presents for a Medicare Annual Wellness Visit.  Visit info / Clinical Intake: Medicare Wellness Visit Type:: Subsequent Annual Wellness Visit Persons participating in visit and providing information:: patient Medicare Wellness Visit Mode:: Telephone If telephone:: video declined Since this visit was completed virtually, some vitals may be partially provided or unavailable. Missing vitals are due to the limitations of the virtual format.: Unable to obtain vitals - no equipment If Telephone or Video please confirm:: I connected with patient using audio/video enable telemedicine. I verified patient identity with two identifiers, discussed telehealth limitations, and patient agreed to proceed. Patient Location:: Home Provider Location:: Home Interpreter Needed?: No Pre-visit prep was completed: yes AWV questionnaire completed by patient prior to visit?: no Living arrangements:: lives with spouse/significant other Patient's Overall Health Status Rating: very good Typical amount of pain: none Does pain affect daily life?: no Are you currently prescribed opioids?: no  Dietary Habits and Nutritional Risks How many meals a day?: 2 Eats fruit and vegetables daily?: yes Most meals are obtained by: preparing own meals; eating out In the last 2 weeks, have you had any of the following?: none Diabetic:: no  Functional Status Activities of Daily Living (to include ambulation/medication): Independent Ambulation: Independent Medication Administration: Independent Home Management (perform basic housework or laundry): Independent Manage your own finances?: yes Primary transportation is: driving Concerns about vision?: (!) yes (eye surgery at [facility] -cornea transplant-4th one.) Concerns about hearing?: no  Fall Screening Falls in the past year?: 0 Number of falls in past year:  0 Was there an injury with Fall?: 0 Fall Risk Category Calculator: 0 Patient Fall Risk Level: Low Fall Risk  Fall Risk Patient at Risk for Falls Due to: No Fall Risks Fall risk Follow up: Falls evaluation completed; Education provided; Falls prevention discussed  Home and Transportation Safety: All rugs have non-skid backing?: N/A, no rugs All stairs or steps have railings?: yes Grab bars in the bathtub or shower?: yes Have non-skid surface in bathtub or shower?: yes Good home lighting?: yes Regular seat belt use?: yes Hospital stays in the last year:: no  Cognitive Assessment Difficulty concentrating, remembering, or making decisions? : no Will 6CIT or Mini Cog be Completed: yes What year is it?: 0 points What month is it?: 0 points Give patient an address phrase to remember (5 components): [address]  [facility] [place] About what time is it?: 0 points Count backwards from 20 to 1: 0 points Say the months of the year in reverse: 0 points Repeat the address phrase from earlier: 0 points 6 CIT Score: 0 points  Advance Directives (For Healthcare) Does Patient Have a Medical Advance Directive?: Yes Does patient want to make changes to medical advance directive?: No - Patient declined Type of Advance Directive: Healthcare Power of [place]; Living will Copy of Healthcare Power of Attorney in Chart?: No - copy requested Copy of Living Will in Chart?: No - copy requested  Reviewed/Updated  Reviewed/Updated: Reviewed All (Medical, Surgical, Family, Medications, Allergies, Care Teams, Patient Goals)    Allergies (verified) Patient has no known allergies.   Current Medications (verified) Outpatient Encounter Medications as of 01/23/2024  Medication Sig   aspirin  EC 81 MG tablet Take 1 tablet (81 mg total) by mouth daily.   atorvastatin  (LIPITOR) 40 MG tablet Take 1 tablet (40 mg total) by mouth daily.   brimonidine (  ALPHAGAN) 0.2 % ophthalmic solution Place 1 drop into the left  eye daily.   carvedilol  (COREG ) 6.25 MG tablet TAKE 1 TABLET BY MOUTH 2 TIMES A DAY   dorzolamide-timolol (COSOPT) 2-0.5 % ophthalmic solution Place 1 drop into the left eye 3 (three) times daily.   latanoprost (XALATAN) 0.005 % ophthalmic solution Place 1 drop into the left eye in the morning and at bedtime.   metoprolol  succinate (TOPROL -XL) 25 MG 24 hr tablet Take 1 tablet by mouth every morning.   nitroGLYCERIN  (NITROSTAT ) 0.4 MG SL tablet Place 1 tablet (0.4 mg total) under the tongue every 5 (five) minutes as needed for chest pain.   olmesartan  (BENICAR ) 40 MG tablet TAKE 1 TABLET BY MOUTH DAILY   prednisoLONE acetate (PRED FORTE) 1 % ophthalmic suspension Place 1 drop into the left eye daily.   rivaroxaban  (XARELTO ) 2.5 MG TABS tablet Take 1 tablet (2.5 mg total) by mouth 2 (two) times daily.   No facility-administered encounter medications on file as of 01/23/2024.    History: Past Medical History:  Diagnosis Date   CAD S/P percutaneous coronary angioplasty 10/2005, 06/2012   a) Inf NSTEMI - distal RCA 90% - PCI; 60% rPDA -- PCI dRCA-PDA: 2 overlapping Cypher DES 3.0 x 13, 3.0 x 18;; b) Crescendo Angina - Abn Myoview with Inf Ischemia --> mLAD 70%-95% (after D1) - PCI w/ Promus P. DES 2.25 x 28 (2.45 mm); p-mRCA 60%, 95%-60%--> PCI w/ 2 overlapping Promus P DES 3.0 x 24 & 3.5 x 18 (dilated p-d: 3.88 - 4.1 - 4.23 - 3.68); OM1 - FFR 0.80 ( Med Rx)   Glaucoma    HTN (hypertension)    Hx of non-ST elevation myocardial infarction (NSTEMI) 10/2005   Inf MI - distal RCA 90% - PCI; 60% rPDA; LCA minimal disease; Normal EF without WMA   Hyperlipidemia LDL goal <70    Past Surgical History:  Procedure Laterality Date   CATARACT EXTRACTION W/ INTRAOCULAR LENS  IMPLANT, BILATERAL Bilateral 1991-  1992   EYE SURGERY     LEFT HEART CATHETERIZATION WITH CORONARY ANGIOGRAM N/A 06/28/2012   Procedure: LEFT HEART CATHETERIZATION WITH CORONARY ANGIOGRAM;  Surgeon: Alm LELON Clay, MD;  Location: [facility]  CATH LAB;  Service: Cardiovascular::  mid LAD 70% & 95% after D1; OM1 70%, RCA: prox 60%, mid 95% with ectasia, distal mid 60%, then distal Cypeher stents going into RPAV - patent;; FFR of OM1 ~0.8 (borderline significant)   PERCUTANEOUS CORONARY STENT INTERVENTION (PCI-S) N/A 06/29/2012   Procedure: PERCUTANEOUS CORONARY STENT INTERVENTION (PCI-S);  Surgeon: Alm LELON Clay, MD;  Location: [facility] CATH LAB;  Service: Cardiovascular:: RCA - 2 overlapping Promus Premier DES 3.0 mm x 24 mm -- 3.5 mm x 18 mm (dilated prox to distal to: 3.88 -- 4.1, 4.25 - 3.68); LAD - Promus Premier DES 2.25 mm x 28 mm - dilated to 2.45 mm   PERCUTANEOUS CORONARY STENT INTERVENTION (PCI-S)  10/2005   2;  Distal RCA: Cypher DES 3.0 mm x 13mm, 3.0 mm x 18 mm overlapping; (06/28/2012)   Family History  Problem Relation Age of Onset   Heart attack Father 71       died of acute mi   Coronary artery disease Brother 84       stents placed   Social History   Occupational History   Occupation: Glass Blower/designer: [employer]    Comment: Retired as of January 2022  Tobacco Use   Smoking status:  Former    Current packs/day: 0.00    Average packs/day: 1 pack/day for 15.0 years (15.0 ttl pk-yrs)    Types: Cigarettes    Start date: 02/22/1977    Quit date: 02/23/1992    Years since quitting: 31.9   Smokeless tobacco: Never  Substance and Sexual Activity   Alcohol use: Yes    Alcohol/week: 6.0 standard drinks of alcohol    Types: 3 Glasses of wine, 3 Cans of beer per week    Comment: 06/28/2012 couple glass of wine or beer maybe 3X/wk   Drug use: Never   Sexual activity: Yes    Birth control/protection: None   Tobacco Counseling Counseling given: Not Answered  SDOH Screenings   Food Insecurity: No Food Insecurity (01/23/2024)  Housing: Low Risk  (01/23/2024)  Transportation Needs: No Transportation Needs (01/23/2024)  Utilities: Not At Risk (01/23/2024)  Alcohol Screen: Low Risk  (01/19/2024)  Depression  (PHQ2-9): Low Risk  (01/23/2024)  Financial Resource Strain: Low Risk  (01/19/2024)  Physical Activity: Sufficiently Active (01/23/2024)  Social Connections: Socially Integrated (01/23/2024)  Stress: No Stress Concern Present (01/23/2024)  Tobacco Use: Medium Risk (12/14/2023)   Received from [facility]  Health Literacy: Adequate Health Literacy (01/23/2024)   See flowsheets for full screening details  Depression Screen PHQ 2 & 9 Depression Scale- Over the past 2 weeks, how often have you been bothered by any of the following problems? Little interest or pleasure in doing things: 0 Feeling down, depressed, or hopeless (PHQ Adolescent also includes...irritable): 0 PHQ-2 Total Score: 0     Goals Addressed             This Visit's Progress    Patient Stated-lose his Covid pounds. Going to [employer] daily.               Objective:    There were no vitals filed for this visit. There is no height or weight on file to calculate BMI.  Hearing/Vision screen No results found. Immunizations and Health Maintenance Health Maintenance  Topic Date Due   Medicare Annual Wellness (AWV)  Never done   Pneumococcal Vaccine: 50+ Years (1 of 2 - PCV) Never done   Zoster Vaccines- Shingrix (1 of 2) Never done   Colonoscopy  Never done   DTaP/Tdap/Td (2 - Tdap) 01/10/2018   COVID-19 Vaccine (5 - Pfizer risk 2025-26 season) 05/21/2024   Influenza Vaccine  Completed   Hepatitis C Screening  Completed   Meningococcal B Vaccine  Aged Out        Assessment/Plan:  This is a routine wellness examination for [name].  Patient Care Team: Lupie Credit, DO as PCP - General (Family Medicine) Anner Alm ORN, MD as PCP - Cardiology (Cardiology)  I have personally reviewed and noted the following in the patient's chart:   Medical and social history Use of alcohol, tobacco or illicit drugs  Current medications and supplements including opioid prescriptions. Functional ability  and status Nutritional status Physical activity Advanced directives List of other physicians Hospitalizations, surgeries, and ER visits in previous 12 months Vitals Screenings to include cognitive, depression, and falls Referrals and appointments  No orders of the defined types were placed in this encounter.  In addition, I have reviewed and discussed with patient certain preventive protocols, quality metrics, and best practice recommendations. A written personalized care plan for preventive services as well as general preventive health recommendations were provided to patient.   Arnette LOISE Hoots, CMA   01/23/2024  No follow-ups on file.  After Visit Summary: (Declined) Due to this being a telephonic visit, with patients personalized plan was offered to patient but patient Declined AVS at this time   Nurse Notes: Patient is doing well physically. Going to the gym daily to lose some weight. He is having cornea transplant for the 4th time coming up. He will schedule a colonoscopy because he says it has probably been right at 10 years, he does not want a referral. He will get his tdap and his shingles vaccine.

## 2024-02-24 ENCOUNTER — Other Ambulatory Visit: Payer: Self-pay
